# Patient Record
Sex: Male | Born: 1985
Health system: Southern US, Community
[De-identification: ages and names within clinical notes are randomized; demographics above are authoritative.]

## PROBLEM LIST (undated history)

## (undated) DIAGNOSIS — Z889 Allergy status to unspecified drugs, medicaments and biological substances status: Secondary | ICD-10-CM

## (undated) DIAGNOSIS — N289 Disorder of kidney and ureter, unspecified: Secondary | ICD-10-CM

## (undated) DIAGNOSIS — I639 Cerebral infarction, unspecified: Secondary | ICD-10-CM

## (undated) DIAGNOSIS — G43909 Migraine, unspecified, not intractable, without status migrainosus: Secondary | ICD-10-CM

## (undated) HISTORY — PX: WISDOM TOOTH EXTRACTION: SHX21

## (undated) HISTORY — PX: FINGER SURGERY: SHX640

## (undated) HISTORY — DX: Cerebral infarction, unspecified: I63.9

## (undated) HISTORY — DX: Migraine, unspecified, not intractable, without status migrainosus: G43.909

---

## 2004-12-23 ENCOUNTER — Emergency Department (HOSPITAL_COMMUNITY): Admission: EM | Admit: 2004-12-23 | Discharge: 2004-12-23 | Payer: Self-pay | Admitting: Emergency Medicine
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
## 2005-04-18 ENCOUNTER — Other Ambulatory Visit: Admission: RE | Admit: 2005-04-18 | Discharge: 2005-04-18 | Payer: Self-pay | Admitting: Otolaryngology

## 2007-03-26 ENCOUNTER — Emergency Department (HOSPITAL_COMMUNITY): Admission: EM | Admit: 2007-03-26 | Discharge: 2007-03-26 | Payer: Self-pay | Admitting: Emergency Medicine

## 2009-07-03 ENCOUNTER — Encounter: Payer: Self-pay | Admitting: Emergency Medicine
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
## 2009-07-04 ENCOUNTER — Inpatient Hospital Stay (HOSPITAL_COMMUNITY)
Admission: EM | Admit: 2009-07-04 | Discharge: 2009-07-05 | Payer: Self-pay | Source: Home / Self Care | Admitting: Emergency Medicine
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
## 2010-06-05 LAB — URINE MICROSCOPIC-ADD ON

## 2010-06-05 LAB — CBC
HCT: 44.1 % (ref 39.0–52.0)
Hemoglobin: 15.5 g/dL (ref 13.0–17.0)
MCHC: 35.1 g/dL (ref 30.0–36.0)
MCV: 87.8 fL (ref 78.0–100.0)
Platelets: 260 10*3/uL (ref 150–400)
RBC: 5.02 MIL/uL (ref 4.22–5.81)
RDW: 12 % (ref 11.5–15.5)
WBC: 12.1 10*3/uL — ABNORMAL HIGH (ref 4.0–10.5)

## 2010-06-05 LAB — COMPREHENSIVE METABOLIC PANEL
ALT: 72 U/L — ABNORMAL HIGH (ref 0–53)
AST: 71 U/L — ABNORMAL HIGH (ref 0–37)
Albumin: 4.4 g/dL (ref 3.5–5.2)
Alkaline Phosphatase: 75 U/L (ref 39–117)
BUN: 14 mg/dL (ref 6–23)
CO2: 27 mEq/L (ref 19–32)
Calcium: 9.2 mg/dL (ref 8.4–10.5)
Chloride: 104 mEq/L (ref 96–112)
Creatinine, Ser: 0.97 mg/dL (ref 0.4–1.5)
GFR calc Af Amer: >60 mL/min (ref >60)
GFR calc non Af Amer: >60 mL/min (ref >60)
Glucose, Bld: 121 mg/dL — ABNORMAL HIGH (ref 70–99)
Potassium: 3.8 mEq/L (ref 3.5–5.1)
Sodium: 138 mEq/L (ref 135–145)
Total Bilirubin: 0.6 mg/dL (ref 0.3–1.2)
Total Protein: 7.1 g/dL (ref 6.0–8.3)

## 2010-06-05 LAB — URINALYSIS, ROUTINE W REFLEX MICROSCOPIC
Bilirubin Urine: NEGATIVE
Glucose, UA: NEGATIVE mg/dL
Ketones, ur: NEGATIVE mg/dL
Leukocytes, UA: NEGATIVE
Nitrite: NEGATIVE
Protein, ur: NEGATIVE mg/dL
Specific Gravity, Urine: 1.025 (ref 1.005–1.030)
Urobilinogen, UA: 0.2 mg/dL (ref 0.0–1.0)
pH: 6 (ref 5.0–8.0)

## 2010-06-05 LAB — PROTIME-INR
INR: 1.04 (ref 0.00–1.49)
Prothrombin Time: 13.5 seconds (ref 11.6–15.2)

## 2010-06-05 LAB — LACTIC ACID, PLASMA: Lactic Acid, Venous: 1.4 mmol/L (ref 0.5–2.2)

## 2010-06-05 LAB — RAPID URINE DRUG SCREEN, HOSP PERFORMED
Amphetamines: NOT DETECTED
Barbiturates: NOT DETECTED
Benzodiazepines: NOT DETECTED
Cocaine: NOT DETECTED
Opiates: NOT DETECTED
Tetrahydrocannabinol: NOT DETECTED

## 2010-06-05 LAB — TYPE AND SCREEN
ABO/RH(D): O POS
Antibody Screen: NEGATIVE

## 2010-06-05 LAB — ETHANOL: Alcohol, Ethyl (B): <5 mg/dL (ref 0–10)

## 2010-06-05 LAB — APTT: aPTT: 25 seconds (ref 24–37)

## 2012-10-10 ENCOUNTER — Emergency Department (HOSPITAL_COMMUNITY)
Admission: EM | Admit: 2012-10-10 | Discharge: 2012-10-10 | Disposition: A | Discharge status: Home / Self Care | Payer: BC Managed Care – PPO | Attending: Emergency Medicine | Admitting: Emergency Medicine

## 2012-10-10 ENCOUNTER — Encounter (HOSPITAL_COMMUNITY): Payer: Self-pay

## 2012-10-10 DIAGNOSIS — S0501XA Injury of conjunctiva and corneal abrasion without foreign body, right eye, initial encounter: Secondary | ICD-10-CM

## 2012-10-10 DIAGNOSIS — IMO0002 Reserved for concepts with insufficient information to code with codable children: Secondary | ICD-10-CM | POA: Insufficient documentation

## 2012-10-10 DIAGNOSIS — Y929 Unspecified place or not applicable: Secondary | ICD-10-CM | POA: Insufficient documentation

## 2012-10-10 DIAGNOSIS — F411 Generalized anxiety disorder: Secondary | ICD-10-CM | POA: Insufficient documentation

## 2012-10-10 DIAGNOSIS — S058X9A Other injuries of unspecified eye and orbit, initial encounter: Secondary | ICD-10-CM | POA: Insufficient documentation

## 2012-10-10 DIAGNOSIS — Y9389 Activity, other specified: Secondary | ICD-10-CM | POA: Insufficient documentation

## 2012-10-10 MED ORDER — TETRACAINE HCL 0.5 % OP SOLN
2.0000 [drp] | Freq: Once | OPHTHALMIC | Status: AC
  Administered 2012-10-10: 2 [drp] via OPHTHALMIC
  Filled 2012-10-10: qty 2

## 2012-10-10 MED ORDER — HYDROCODONE-ACETAMINOPHEN 5-325 MG PO TABS: ORAL_TABLET | ORAL | Status: DC

## 2012-10-10 MED ORDER — FLUORESCEIN SODIUM 1 MG OP STRP
ORAL_STRIP | OPHTHALMIC | Status: AC
  Filled 2012-10-10: qty 1

## 2012-10-10 MED ORDER — TOBRAMYCIN 0.3 % OP SOLN
2.0000 [drp] | Freq: Once | OPHTHALMIC | Status: AC
  Administered 2012-10-10: 2 [drp] via OPHTHALMIC
  Filled 2012-10-10: qty 5

## 2012-10-10 MED ORDER — HYDROCODONE-ACETAMINOPHEN 5-325 MG PO TABS
2.0000 | ORAL_TABLET | Freq: Once | ORAL | Status: AC
  Administered 2012-10-10: 2 via ORAL
  Filled 2012-10-10: qty 2

## 2012-10-10 MED ORDER — HYDROCODONE-ACETAMINOPHEN 5-325 MG PO TABS: 1.0000 | ORAL_TABLET | ORAL | Status: DC | PRN

## 2012-10-10 NOTE — ED Provider Notes (Signed)
Medical screening examination/treatment/procedure(s) were performed by non-physician practitioner and as supervising physician I was immediately available for consultation/collaboration. Skylan Gift, MD, FACEP   Braelin Brosch L Colen Eltzroth, MD 10/10/12 2339 

## 2012-10-10 NOTE — ED Provider Notes (Signed)
CSN: [id number]     Arrival date & time 10/10/12  2046 History     First MD Initiated Contact with Patient 10/10/12 2104     Chief Complaint  Patient presents with  . Foreign Body in Eye   (Consider location/radiation/quality/duration/timing/severity/associated sxs/prior Treatment) HPI Comments: Patient states he was cutting plywood today when something went in his right eye. The patient states he immediately tried to rub it and then irrigated the eye. He later used eyedrops from over-the-counter, but noticed a sensation in the eye that something might be in it. He now presents to the emergency department for additional evaluation. The patient denies any previous operations or procedures involving the right eye. He has used over-the-counter Visine as well as a flush the eye but no other medications.  The history is provided by the patient.    History reviewed. No pertinent past medical history. History reviewed. No pertinent past surgical history. No family history on file. History  Substance Use Topics  . Smoking status: Never Smoker   . Smokeless tobacco: Not on file  . Alcohol Use: No    Review of Systems  Constitutional: Negative for activity change.       All ROS Neg except as noted in HPI  HENT: Negative for nosebleeds and neck pain.   Eyes: Positive for pain. Negative for photophobia and discharge.  Respiratory: Negative for cough, shortness of breath and wheezing.   Cardiovascular: Negative for chest pain and palpitations.  Gastrointestinal: Negative for abdominal pain and blood in stool.  Genitourinary: Negative for dysuria, frequency and hematuria.  Musculoskeletal: Negative for back pain and arthralgias.  Skin: Negative.   Neurological: Negative for dizziness, seizures and speech difficulty.  Psychiatric/Behavioral: Negative for hallucinations and confusion.    Allergies  Review of patient's allergies indicates no known allergies.  Home Medications  No current  outpatient prescriptions on file. BP 136/80  Pulse 76  Temp(Src) 98.3 F (36.8 C) (Oral)  Ht 5\' 10"  (1.778 m)  Wt 190 lb (86.183 kg)  BMI 27.26 kg/m2  SpO2 96% Physical Exam  Nursing note and vitals reviewed. Constitutional: He is oriented to person, place, and time. He appears well-developed and well-nourished.  Non-toxic appearance.  HENT:  Head: Normocephalic.  Right Ear: Tympanic membrane and external ear normal.  Left Ear: Tympanic membrane and external ear normal.  Eyes: EOM and lids are normal. Pupils are equal, round, and reactive to light.  The upper and lower eyelids were inverted and no foreign body appreciated. There is some swelling of the bulbar conjunctiva. There is increased redness of the conjunctiva.  The anterior chamber is clear. Fluorescein evaluation with slit-lamp suggests mild corneal abrasion present.  No periorbital tenderness appreciated.  Neck: Normal range of motion. Neck supple. Carotid bruit is not present.  Cardiovascular: Normal rate, regular rhythm, normal heart sounds, intact distal pulses and normal pulses.   Pulmonary/Chest: Breath sounds normal. No respiratory distress.  Abdominal: Soft. Bowel sounds are normal. There is no tenderness. There is no guarding.  Musculoskeletal: Normal range of motion.  Lymphadenopathy:       Head (right side): No submandibular adenopathy present.       Head (left side): No submandibular adenopathy present.    He has no cervical adenopathy.  Neurological: He is alert and oriented to person, place, and time. He has normal strength. No cranial nerve deficit or sensory deficit.  Skin: Skin is warm and dry.  Psychiatric: His speech is normal. His mood appears anxious.  ED Course   Procedures (including critical care time)  Labs Reviewed - No data to display No results found. No diagnosis found.  MDM  *I have reviewed nursing notes, vital signs, and all appropriate lab and imaging results for this  patient.** Patient was cutting plywood without eye safety gear today and noticed something went in his right eye. The patient has a flush the area and rubbed the eye and eyelid. On the examination there is mild swelling of the bulbar conjunctiva. There is suggestion of corneal abrasion on the fluorescein slit lamp exam. Patient will use dark glasses and a hat until eye problem resolves. He has been given tobramycin eyedrops every 4 hours for the next 5-7 days. He is given a prescription for Norco for pain and headache if needed. He is to see the eye specialist if not improving, or return to the emergency department.  Kathie Dike, PA-C 10/10/12 2239

## 2012-10-10 NOTE — ED Notes (Signed)
Pt presents with left eye pain. Pt states he was cutting wood when he felt something fly into his eye. Pt has redness and irritation to the eye. Pt reports "some" blurred vision.

## 2012-10-10 NOTE — ED Notes (Signed)
Cutting come plywood and got something in my right eye per pt. Not sure if it is out, real irritated per pt. Can't open my eye.

## 2012-10-10 NOTE — ED Notes (Signed)
Can see pretty good, right eye has been flushed out and visine has been used in eye.

## 2012-10-12 MED FILL — Hydrocodone-Acetaminophen Tab 5-325 MG: ORAL | Qty: 6 | Status: AC

## 2012-12-17 ENCOUNTER — Emergency Department (HOSPITAL_COMMUNITY): Payer: BC Managed Care – PPO

## 2012-12-17 ENCOUNTER — Emergency Department (HOSPITAL_COMMUNITY)
Admission: EM | Admit: 2012-12-17 | Discharge: 2012-12-17 | Disposition: A | Discharge status: Home / Self Care | Payer: BC Managed Care – PPO | Attending: Emergency Medicine | Admitting: Emergency Medicine

## 2012-12-17 ENCOUNTER — Encounter (HOSPITAL_COMMUNITY): Payer: Self-pay | Admitting: Emergency Medicine

## 2012-12-17 DIAGNOSIS — N1330 Unspecified hydronephrosis: Secondary | ICD-10-CM | POA: Insufficient documentation

## 2012-12-17 DIAGNOSIS — N132 Hydronephrosis with renal and ureteral calculous obstruction: Secondary | ICD-10-CM

## 2012-12-17 DIAGNOSIS — R52 Pain, unspecified: Secondary | ICD-10-CM | POA: Insufficient documentation

## 2012-12-17 DIAGNOSIS — N201 Calculus of ureter: Secondary | ICD-10-CM | POA: Insufficient documentation

## 2012-12-17 LAB — URINALYSIS, ROUTINE W REFLEX MICROSCOPIC
Bilirubin Urine: NEGATIVE
Glucose, UA: NEGATIVE mg/dL
Ketones, ur: NEGATIVE mg/dL
Leukocytes, UA: NEGATIVE
Nitrite: NEGATIVE
Protein, ur: NEGATIVE mg/dL
Specific Gravity, Urine: 1.027 (ref 1.005–1.030)
Urobilinogen, UA: 0.2 mg/dL (ref 0.0–1.0)
pH: 6.5 (ref 5.0–8.0)

## 2012-12-17 LAB — URINE MICROSCOPIC-ADD ON

## 2012-12-17 IMAGING — CT CT ABD-PELV W/O CM
1 series · 15 of 28 positions shown, 19 images · non-contrast
Comparison: 07/03/2009

CLINICAL DATA: Left flank pain

EXAM:
CT ABDOMEN AND PELVIS WITHOUT CONTRAST
TECHNIQUE: Multidetector CT imaging of the abdomen and pelvis was performed
following the standard protocol without intravenous contrast.

[Series 4: lung · axial · 0.74mm/px · z∈[-161,-41]mm · 15 of 28 slices shown, 19 images]
[im 3/28  soft-tissue]
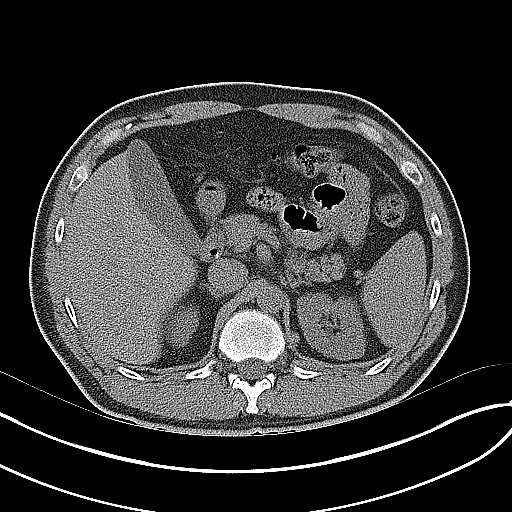
[im 3/28  bone]
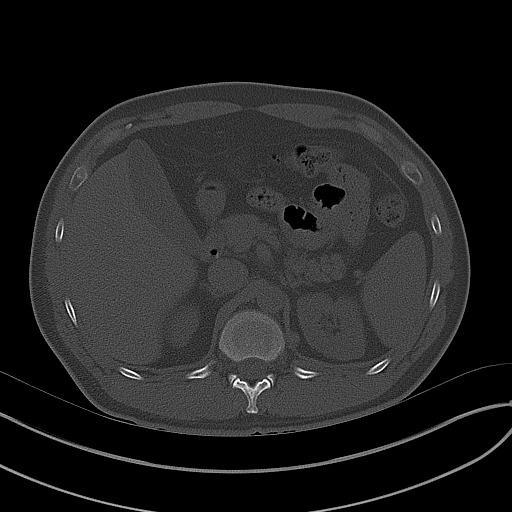
[im 5/28  soft-tissue]
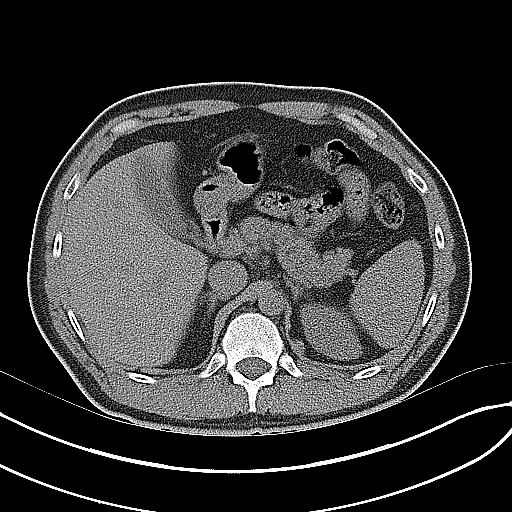
[im 7/28  soft-tissue]
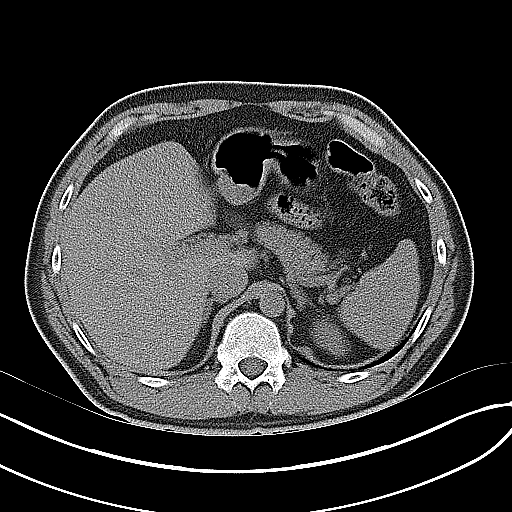
[im 9/28  soft-tissue]
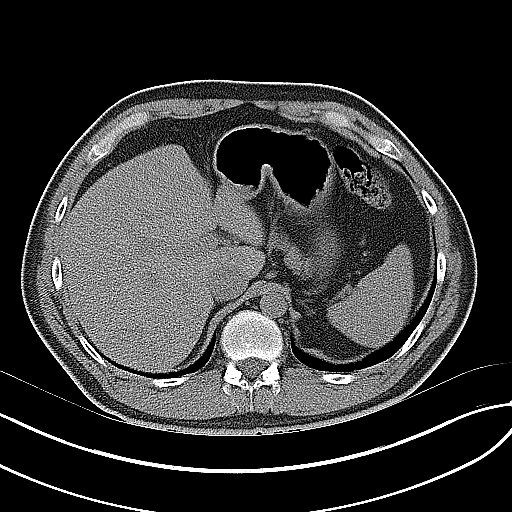
[im 11/28  soft-tissue]
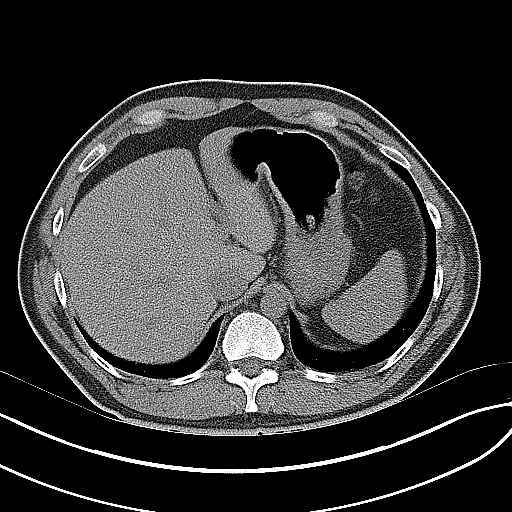
[im 13/28  soft-tissue]
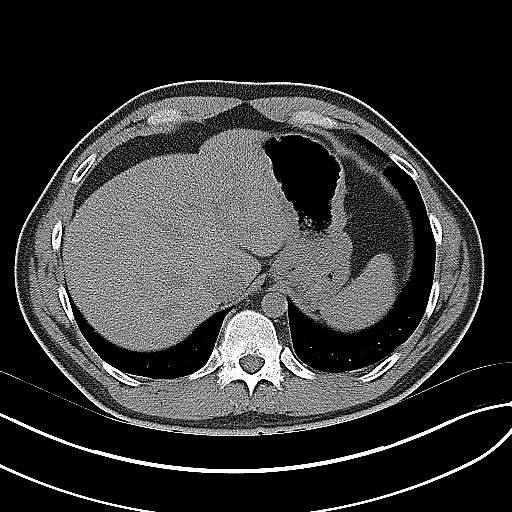
[im 15/28  soft-tissue]
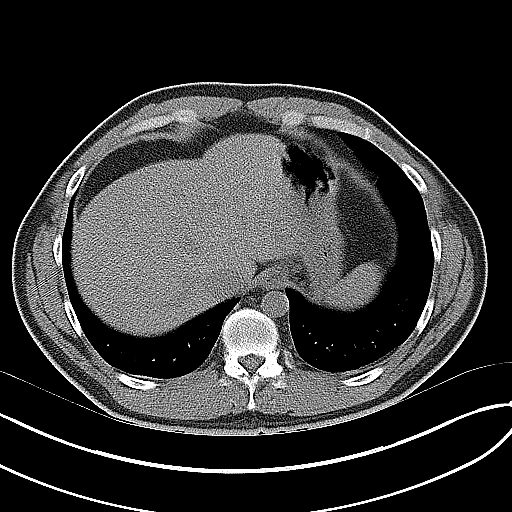
[im 17/28  soft-tissue]
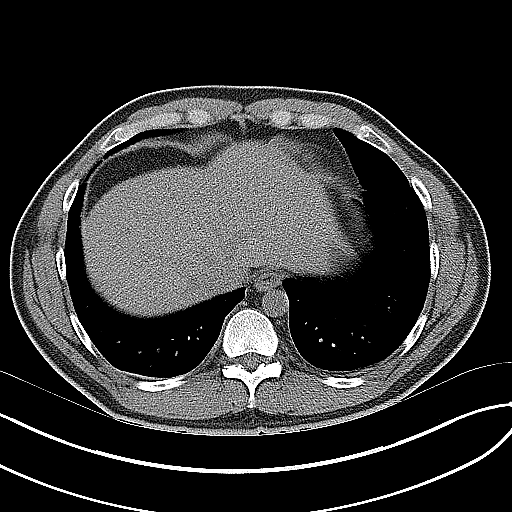
[im 19/28  soft-tissue]
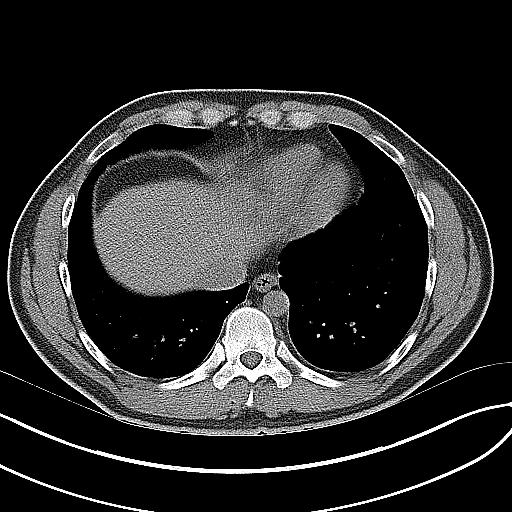
[im 19/28  bone]
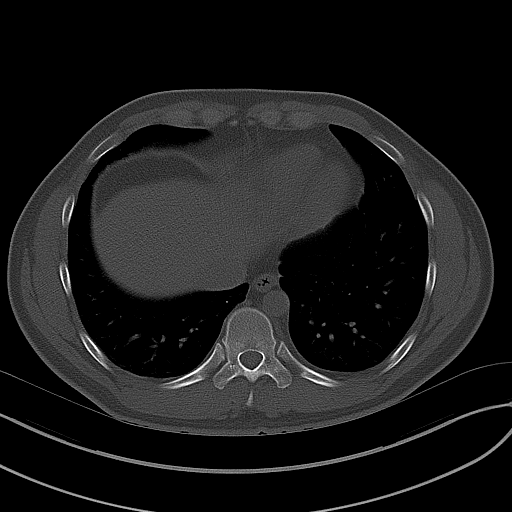
[im 21/28  soft-tissue]
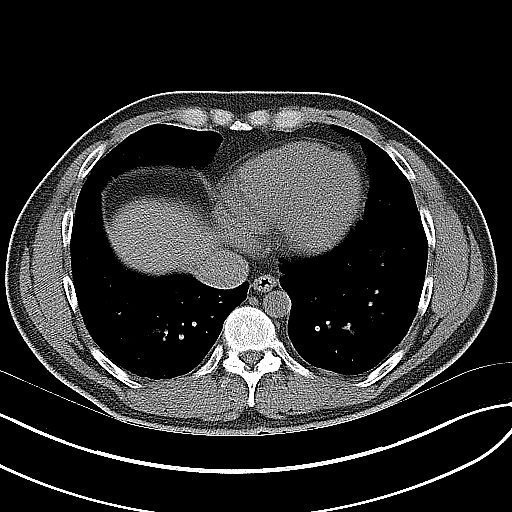
[im 23/28  soft-tissue]
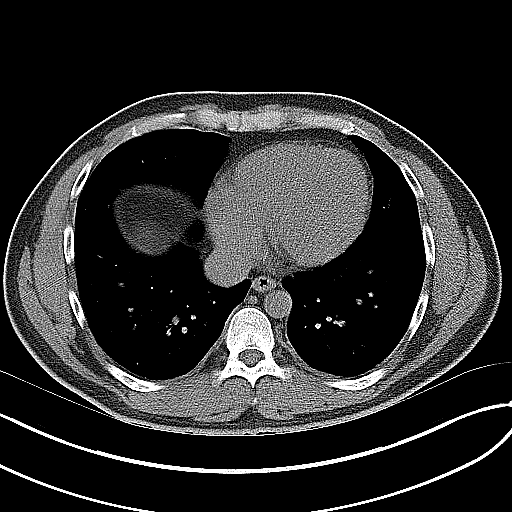
[im 24/28  lung]
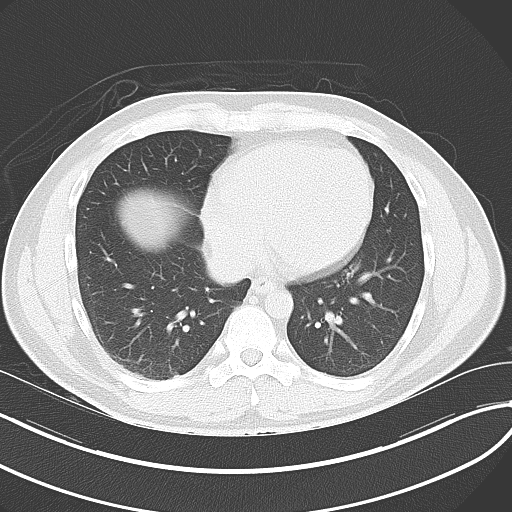
[im 25/28  soft-tissue]
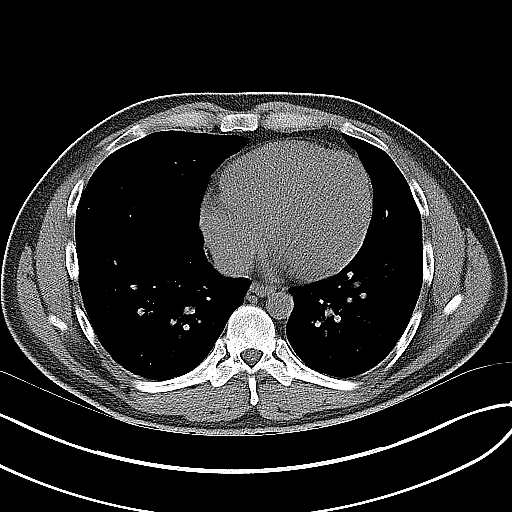
[im 25/28  lung]
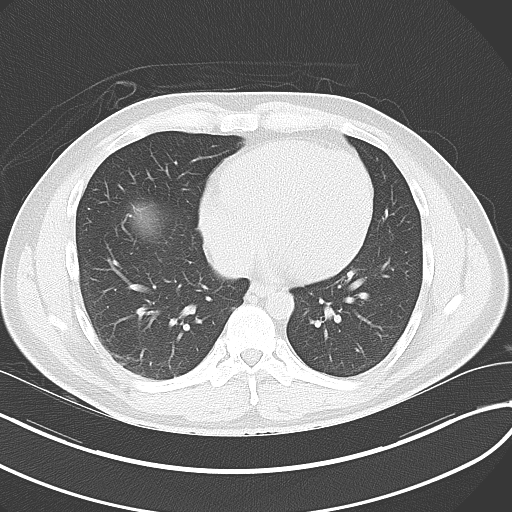
[im 26/28  lung]
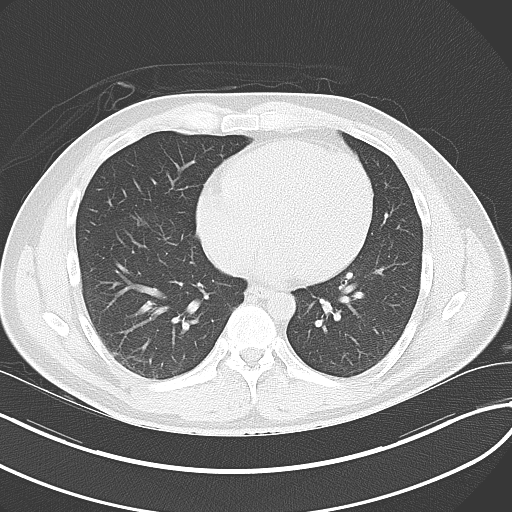
[im 27/28  soft-tissue]
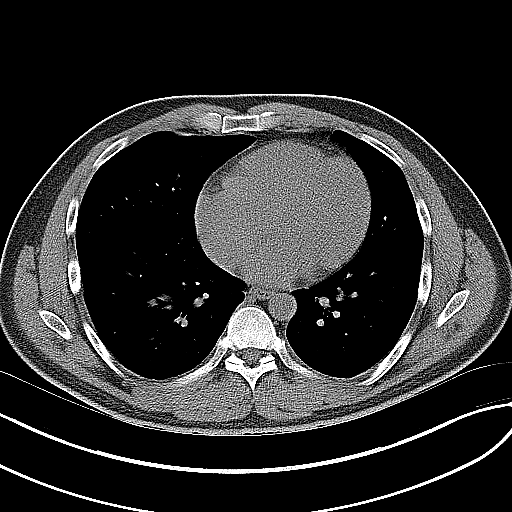
[im 27/28  lung]
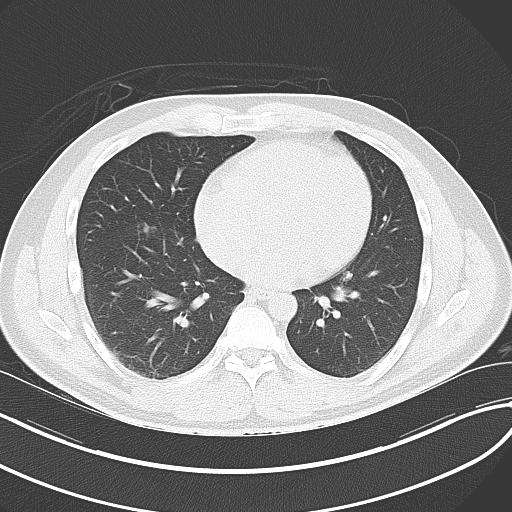

[15 of 28 positions shown; findings below may reference images not displayed]

FINDINGS: The lung bases are free of acute infiltrate or sizable effusion.

The liver, spleen, adrenal glands, gallbladder and pancreas are
within normal limits. The kidneys are well visualized bilaterally
without evidence of renal calculi. Mild fullness of the left renal
collecting system and left ureter are noted when compared with that
of the right. This extends to the urinary bladder without definitive
calculus. This could be related to a poorly calcified stone or edema
from recently passed stone. Correlation with the patient's clinical
history is recommended.

The appendix is well visualized and within normal limits. The colon
shows very mild diverticular change without diverticulitis. The
bladder is decompressed. No pelvic mass lesion is noted. No acute
bony abnormality is seen.
IMPRESSION: Mild fullness of the left renal collecting system and ureter. This
may be related to edema from recently passed stone or a poorly
calcified stone.

No other focal abnormality is seen.

## 2012-12-17 MED ORDER — ONDANSETRON 8 MG PO TBDP: 8.0000 mg | ORAL_TABLET | Freq: Three times a day (TID) | ORAL | Status: DC | PRN

## 2012-12-17 MED ORDER — KETOROLAC TROMETHAMINE 30 MG/ML IJ SOLN
30.0000 mg | Freq: Once | INTRAMUSCULAR | Status: AC
  Administered 2012-12-17: 30 mg via INTRAVENOUS
  Filled 2012-12-17: qty 1

## 2012-12-17 MED ORDER — KETOROLAC TROMETHAMINE 10 MG PO TABS: 10.0000 mg | ORAL_TABLET | Freq: Four times a day (QID) | ORAL | Status: DC

## 2012-12-17 MED ORDER — ONDANSETRON HCL 4 MG/2ML IJ SOLN
4.0000 mg | Freq: Once | INTRAMUSCULAR | Status: AC
  Administered 2012-12-17: 4 mg via INTRAVENOUS
  Filled 2012-12-17: qty 2

## 2012-12-17 MED ORDER — HYDROMORPHONE HCL PF 1 MG/ML IJ SOLN
1.0000 mg | Freq: Once | INTRAMUSCULAR | Status: AC
  Administered 2012-12-17: 1 mg via INTRAVENOUS
  Filled 2012-12-17: qty 1

## 2012-12-17 MED ORDER — OXYCODONE-ACETAMINOPHEN 5-325 MG PO TABS: 1.0000 | ORAL_TABLET | Freq: Four times a day (QID) | ORAL | Status: DC | PRN

## 2012-12-17 MED ORDER — SODIUM CHLORIDE 0.9 % IV SOLN
INTRAVENOUS | Status: DC
  Administered 2012-12-17: 15:00:00 via INTRAVENOUS

## 2012-12-17 NOTE — ED Notes (Signed)
Pt c/o of sudden onset of left side flank pain. Denies blood in urin, pain upon urination, no history. Fent. Pain 4/10.

## 2012-12-17 NOTE — ED Provider Notes (Signed)
CSN: [id number]     Arrival date & time 12/17/12  1446 History   First MD Initiated Contact with Patient 12/17/12 1455     Chief Complaint  Patient presents with  . Flank Pain   (Consider location/radiation/quality/duration/timing/severity/associated sxs/prior Treatment) HPI Patient reports he was at work and after lunch he had acute onset of left flank pain after bending down to get his uniform to put on. He states it started about 45 minutes prior to EMS arrival. He states the pain is in his left flank area and he now has a pressure feeling in his left lower quadrant. He has had nausea without vomiting. He states he feels the need to defecate however he had a bowel movement without relief of pain. He states the pain was so bad he was laying on the floor. He states nothing he does makes it feel better, nothing he does makes it feel worse. He denies having hematuria. He denies having dysuria or frequency. He states he's never had this discomfort before. He denies any family history of renal stones. Patient does not drink a lot of milk but he does drink a lot of caffeine. He reports his pain at its worse was a 9/10, after EMS gave meds it was a 3/10 and his pain is returning now and is a 9/10 again.   PCP none  History reviewed. No pertinent past medical history. History reviewed. No pertinent past surgical history. History reviewed. No pertinent family history. History  Substance Use Topics  . Smoking status: Never Smoker   . Smokeless tobacco: Not on file  . Alcohol Use: No  lives at home  Lives with spouse employed  Review of Systems  All other systems reviewed and are negative.    Allergies  Review of patient's allergies indicates no known allergies.  Home Medications  No current outpatient prescriptions on file.   BP 121/66  Pulse 69  Temp(Src) 98.7 F (37.1 C) (Oral)  Resp 20  SpO2 98%  Vital signs normal   Physical Exam  Nursing note and vitals  reviewed. Constitutional: He is oriented to person, place, and time. He appears well-developed and well-nourished.  Non-toxic appearance. He does not appear ill. He appears distressed.  HENT:  Head: Normocephalic and atraumatic.  Right Ear: External ear normal.  Left Ear: External ear normal.  Nose: Nose normal. No mucosal edema or rhinorrhea.  Mouth/Throat: Oropharynx is clear and moist and mucous membranes are normal. No dental abscesses or edematous.  Eyes: Conjunctivae and EOM are normal. Pupils are equal, round, and reactive to light.  Neck: Normal range of motion and full passive range of motion without pain. Neck supple.  Cardiovascular: Normal rate, regular rhythm and normal heart sounds.  Exam reveals no gallop and no friction rub.   No murmur heard. Pulmonary/Chest: Effort normal and breath sounds normal. No respiratory distress. He has no wheezes. He has no rhonchi. He has no rales. He exhibits no tenderness and no crepitus.  Abdominal: Soft. Normal appearance and bowel sounds are normal. He exhibits no distension. There is no tenderness. There is no rebound and no guarding.    Area pressure, nontender  Musculoskeletal: Normal range of motion. He exhibits no edema and no tenderness.       Back:  Moves all extremities well.   Area of pain noted  Neurological: He is alert and oriented to person, place, and time. He has normal strength. No cranial nerve deficit.  Skin: Skin is warm, dry and  intact. No rash noted. No erythema. There is pallor.  Psychiatric: He has a normal mood and affect. His speech is normal and behavior is normal. His mood appears not anxious.    ED Course  Procedures (including critical care time)  Medications  0.9 %  sodium chloride infusion ( Intravenous Stopped 12/17/12 1701)  ketorolac (TORADOL) 30 MG/ML injection 30 mg (not administered)  HYDROmorphone (DILAUDID) injection 1 mg (1 mg Intravenous Given 12/17/12 1524)  ondansetron (ZOFRAN) injection 4  mg (4 mg Intravenous Given 12/17/12 1524)   Pt states his pain is better, still looks pale.   Labs Review Results for orders placed during the hospital encounter of 12/17/12  URINALYSIS, ROUTINE W REFLEX MICROSCOPIC      Result Value Range   Color, Urine YELLOW  YELLOW   APPearance CLOUDY (*) CLEAR   Specific Gravity, Urine 1.027  1.005 - 1.030   pH 6.5  5.0 - 8.0   Glucose, UA NEGATIVE  NEGATIVE mg/dL   Hgb urine dipstick LARGE (*) NEGATIVE   Bilirubin Urine NEGATIVE  NEGATIVE   Ketones, ur NEGATIVE  NEGATIVE mg/dL   Protein, ur NEGATIVE  NEGATIVE mg/dL   Urobilinogen, UA 0.2  0.0 - 1.0 mg/dL   Nitrite NEGATIVE  NEGATIVE   Leukocytes, UA NEGATIVE  NEGATIVE  URINE MICROSCOPIC-ADD ON      Result Value Range   Squamous Epithelial / LPF RARE  RARE   RBC / HPF 21-50  <3 RBC/hpf   Laboratory interpretation all normal except hematuria   Imaging Review Ct Abdomen Pelvis Wo Contrast  12/17/2012   CLINICAL DATA:  Left flank pain  EXAM: CT ABDOMEN AND PELVIS WITHOUT CONTRAST  TECHNIQUE: Multidetector CT imaging of the abdomen and pelvis was performed following the standard protocol without intravenous contrast.  COMPARISON:  07/03/2009  FINDINGS: The lung bases are free of acute infiltrate or sizable effusion.  The liver, spleen, adrenal glands, gallbladder and pancreas are within normal limits. The kidneys are well visualized bilaterally without evidence of renal calculi. Mild fullness of the left renal collecting system and left ureter are noted when compared with that of the right. This extends to the urinary bladder without definitive calculus. This could be related to a poorly calcified stone or edema from recently passed stone. Correlation with the patient's clinical history is recommended.  The appendix is well visualized and within normal limits. The colon shows very mild diverticular change without diverticulitis. The bladder is decompressed. No pelvic mass lesion is noted. No acute bony  abnormality is seen.  IMPRESSION: Mild fullness of the left renal collecting system and ureter. This may be related to edema from recently passed stone or a poorly calcified stone.  No other focal abnormality is seen.   Electronically Signed   By: Alcide Clever   On: 12/17/2012 16:28    MDM   1. Ureteral stone with hydronephrosis     New Prescriptions   KETOROLAC (TORADOL) 10 MG TABLET    Take 1 tablet (10 mg total) by mouth QID.   ONDANSETRON (ZOFRAN ODT) 8 MG DISINTEGRATING TABLET    Take 1 tablet (8 mg total) by mouth every 8 (eight) hours as needed for nausea.   OXYCODONE-ACETAMINOPHEN (PERCOCET/ROXICET) 5-325 MG PER TABLET    Take 1 tablet by mouth every 6 (six) hours as needed for pain.    Plan discharge   Devoria Albe, MD, Franz Dell, MD 12/17/12 [phone]

## 2012-12-17 NOTE — ED Notes (Signed)
Bed: ZO10 Expected date:  Expected time:  Means of arrival:  Comments: ems- 27 yo M, sudden onset flank pain

## 2013-10-22 ENCOUNTER — Encounter: Payer: Self-pay | Admitting: Family Medicine

## 2013-10-22 ENCOUNTER — Encounter (INDEPENDENT_AMBULATORY_CARE_PROVIDER_SITE_OTHER): Payer: Self-pay

## 2013-10-22 ENCOUNTER — Ambulatory Visit (INDEPENDENT_AMBULATORY_CARE_PROVIDER_SITE_OTHER): Payer: BC Managed Care – PPO | Admitting: Family Medicine

## 2013-10-22 VITALS — BP 121/78 | HR 58 | Temp 96.8°F | Ht 70.0 in | Wt 190.0 lb

## 2013-10-22 DIAGNOSIS — J0100 Acute maxillary sinusitis, unspecified: Secondary | ICD-10-CM

## 2013-10-22 MED ORDER — AMOXICILLIN 875 MG PO TABS: 875.0000 mg | ORAL_TABLET | Freq: Two times a day (BID) | ORAL | Status: DC

## 2013-10-22 MED ORDER — METHYLPREDNISOLONE ACETATE 80 MG/ML IJ SUSP
80.0000 mg | Freq: Once | INTRAMUSCULAR | Status: AC
  Administered 2013-10-22: 80 mg via INTRAMUSCULAR

## 2013-10-22 NOTE — Progress Notes (Signed)
   Subjective:    Patient ID: Kevin Hughes, male    DOB: Aug 12, 1985, 28 y.o.   MRN: [id number]  HPI  This 28 y.o. male presents for evaluation of facial pressure and URI sx's for a week.  He c/o mucopurulent sinus drainage this am and feeling washed out.  Review of Systems C/o sinus pain and pressure   No chest pain, SOB, HA, dizziness, vision change, N/V, diarrhea, constipation, dysuria, urinary urgency or frequency, myalgias, arthralgias or rash.  Objective:   Physical Exam  Vital signs noted  Well developed well nourished male.  HEENT - Head atraumatic Normocephalic                Eyes - PERRLA, Conjuctiva - clear Sclera- Clear EOMI                Ears - EAC's Wnl TM's Wnl Gross Hearing WNL                Nose - Nares patent                 Throat - oropharanx wnl Respiratory - Lungs CTA bilateral Cardiac - RRR S1 and S2 without murmur GI - Abdomen soft Nontender and bowel sounds active x 4 Extremities - No edema. Neuro - Grossly intact.      Assessment & Plan:  Acute maxillary sinusitis, recurrence not specified - Plan: amoxicillin (AMOXIL) 875 MG tablet, methylPREDNISolone acetate (DEPO-MEDROL) injection 80 mg  Push po fluids, rest, tylenol and motrin otc prn as directed for fever, arthralgias, and myalgias.  Follow up prn if sx's continue or persist.   Deatra CanterWilliam J Oxford FNP

## 2013-10-25 ENCOUNTER — Encounter (HOSPITAL_COMMUNITY): Payer: Self-pay | Admitting: Emergency Medicine

## 2013-11-23 ENCOUNTER — Encounter: Payer: Self-pay | Admitting: Family Medicine

## 2013-11-23 ENCOUNTER — Ambulatory Visit (INDEPENDENT_AMBULATORY_CARE_PROVIDER_SITE_OTHER): Payer: BC Managed Care – PPO | Admitting: Family Medicine

## 2013-11-23 VITALS — BP 119/78 | HR 84 | Temp 97.8°F | Ht 70.0 in | Wt 187.7 lb

## 2013-11-23 DIAGNOSIS — J0100 Acute maxillary sinusitis, unspecified: Secondary | ICD-10-CM

## 2013-11-23 MED ORDER — AMOXICILLIN 875 MG PO TABS: 875.0000 mg | ORAL_TABLET | Freq: Two times a day (BID) | ORAL | Status: DC

## 2013-11-23 MED ORDER — METHYLPREDNISOLONE ACETATE 80 MG/ML IJ SUSP
80.0000 mg | Freq: Once | INTRAMUSCULAR | Status: AC
  Administered 2013-11-23: 80 mg via INTRAMUSCULAR

## 2013-11-23 NOTE — Progress Notes (Signed)
   Subjective:    Patient ID: Delan Ksiazek, male    DOB: 1985/07/19, 28 y.o.   MRN: [id number]  HPI This 28 y.o. male presents for evaluation of c/o sinus pressure and uri sx's.   Review of Systems No chest pain, SOB, HA, dizziness, vision change, N/V, diarrhea, constipation, dysuria, urinary urgency or frequency, myalgias, arthralgias or rash.     Objective:   Physical Exam  Vital signs noted  Well developed well nourished male.  HEENT - Head atraumatic Normocephalic                Eyes - PERRLA, Conjuctiva - clear Sclera- Clear EOMI                Ears - EAC's Wnl TM's Wnl Gross Hearing WNL                Nose - Nares patent                 Throat - oropharanx wnl Respiratory - Lungs CTA bilateral Cardiac - RRR S1 and S2 without murmur GI - Abdomen soft Nontender and bowel sounds active x 4 Extremities - No edema. Neuro - Grossly intact.      Assessment & Plan:  Acute maxillary sinusitis, recurrence not specified - Plan: amoxicillin (AMOXIL) 875 MG tablet, methylPREDNISolone acetate (DEPO-MEDROL) injection 80 mg  Push po fluids, rest, tylenol and motrin otc prn as directed for fever, arthralgias, and myalgias.  Follow up prn if sx's continue or persist.  Deatra Canter FNP

## 2013-11-25 ENCOUNTER — Telehealth: Payer: Self-pay | Admitting: *Deleted

## 2013-11-25 ENCOUNTER — Encounter: Payer: Self-pay | Admitting: *Deleted

## 2013-11-25 NOTE — Telephone Encounter (Signed)
Patient has not been able to sleep since he was seen due to cough. Can something be sent in for cough? Also wants to know if he can have a work note for today and tomorrow. Please advise

## 2013-11-29 NOTE — Telephone Encounter (Signed)
Need to be seen for work note and recommend delsyn otc until seen

## 2013-11-29 NOTE — Telephone Encounter (Signed)
Work note was given on 11/25/13 to cover until 11/27/13.

## 2014-04-30 ENCOUNTER — Ambulatory Visit (INDEPENDENT_AMBULATORY_CARE_PROVIDER_SITE_OTHER): Payer: BLUE CROSS/BLUE SHIELD | Admitting: Nurse Practitioner

## 2014-04-30 VITALS — BP 136/79 | HR 64 | Temp 97.6°F | Ht 70.0 in | Wt 190.6 lb

## 2014-04-30 DIAGNOSIS — M545 Low back pain, unspecified: Secondary | ICD-10-CM

## 2014-04-30 LAB — POCT URINALYSIS DIPSTICK
Bilirubin, UA: NEGATIVE
Glucose, UA: NEGATIVE
Ketones, UA: NEGATIVE
Leukocytes, UA: NEGATIVE
Nitrite, UA: NEGATIVE
Protein, UA: NEGATIVE
Spec Grav, UA: 1.015
Urobilinogen, UA: NEGATIVE
pH, UA: 7

## 2014-04-30 LAB — POCT UA - MICROSCOPIC ONLY
Casts, Ur, LPF, POC: NEGATIVE
Crystals, Ur, HPF, POC: NEGATIVE
Mucus, UA: NEGATIVE

## 2014-04-30 MED ORDER — CYCLOBENZAPRINE HCL 10 MG PO TABS: 10.0000 mg | ORAL_TABLET | Freq: Three times a day (TID) | ORAL | Status: DC | PRN

## 2014-04-30 MED ORDER — NAPROXEN 500 MG PO TABS: 500.0000 mg | ORAL_TABLET | Freq: Two times a day (BID) | ORAL | Status: DC

## 2014-04-30 NOTE — Patient Instructions (Signed)
Back Pain, Adult Low back pain is very common. About 1 in 5 people have back pain.The cause of low back pain is rarely dangerous. The pain often gets better over time.About half of people with a sudden onset of back pain feel better in just 2 weeks. About 8 in 10 people feel better by 6 weeks.  CAUSES Some common causes of back pain include:  Strain of the muscles or ligaments supporting the spine.  Wear and tear (degeneration) of the spinal discs.  Arthritis.  Direct injury to the back. DIAGNOSIS Most of the time, the direct cause of low back pain is not known.However, back pain can be treated effectively even when the exact cause of the pain is unknown.Answering your caregiver's questions about your overall health and symptoms is one of the most accurate ways to make sure the cause of your pain is not dangerous. If your caregiver needs more information, he or she may order lab work or imaging tests (X-rays or MRIs).However, even if imaging tests show changes in your back, this usually does not require surgery. HOME CARE INSTRUCTIONS For many people, back pain returns.Since low back pain is rarely dangerous, it is often a condition that people can learn to manageon their own.   Remain active. It is stressful on the back to sit or stand in one place. Do not sit, drive, or stand in one place for more than 30 minutes at a time. Take short walks on level surfaces as soon as pain allows.Try to increase the length of time you walk each day.  Do not stay in bed.Resting more than 1 or 2 days can delay your recovery.  Do not avoid exercise or work.Your body is made to move.It is not dangerous to be active, even though your back may hurt.Your back will likely heal faster if you return to being active before your pain is gone.  Pay attention to your body when you bend and lift. Many people have less discomfortwhen lifting if they bend their knees, keep the load close to their bodies,and  avoid twisting. Often, the most comfortable positions are those that put less stress on your recovering back.  Find a comfortable position to sleep. Use a firm mattress and lie on your side with your knees slightly bent. If you lie on your back, put a pillow under your knees.  Only take over-the-counter or prescription medicines as directed by your caregiver. Over-the-counter medicines to reduce pain and inflammation are often the most helpful.Your caregiver may prescribe muscle relaxant drugs.These medicines help dull your pain so you can more quickly return to your normal activities and healthy exercise.  Put ice on the injured area.  Put ice in a plastic bag.  Place a towel between your skin and the bag.  Leave the ice on for 15-20 minutes, 03-04 times a day for the first 2 to 3 days. After that, ice and heat may be alternated to reduce pain and spasms.  Ask your caregiver about trying back exercises and gentle massage. This may be of some benefit.  Avoid feeling anxious or stressed.Stress increases muscle tension and can worsen back pain.It is important to recognize when you are anxious or stressed and learn ways to manage it.Exercise is a great option. SEEK MEDICAL CARE IF:  You have pain that is not relieved with rest or medicine.  You have pain that does not improve in 1 week.  You have new symptoms.  You are generally not feeling well. SEEK   IMMEDIATE MEDICAL CARE IF:   You have pain that radiates from your back into your legs.  You develop new bowel or bladder control problems.  You have unusual weakness or numbness in your arms or legs.  You develop nausea or vomiting.  You develop abdominal pain.  You feel faint. Document Released: 03/04/2005 Document Revised: 09/03/2011 Document Reviewed: 07/06/2013 [employer] Patient Information 2015 [employer]. This information is not intended to replace advice given to you by your health care provider. Make sure you  discuss any questions you have with your health care provider.  

## 2014-04-30 NOTE — Progress Notes (Signed)
   Subjective:    Patient ID: Kevin Hughes, male    DOB: 06-11-1985, 29 y.o.   MRN: [id number]  HPI Patient in c/o low back pian- started all the sudden at lunch time yesterday- he denies any injury but does do a lot of bending and stooping at work. He also has a history of kidney stones. Last one being over a year ago. Denies any dysuria, frequency and urgency.    Review of Systems  Constitutional: Negative.   HENT: Negative.   Respiratory: Negative.   Cardiovascular: Negative.   Genitourinary: Negative.   Neurological: Negative.   Psychiatric/Behavioral: Negative.   All other systems reviewed and are negative.      Objective:   Physical Exam  Constitutional: He is oriented to person, place, and time. He appears well-developed and well-nourished.  Cardiovascular: Normal rate, regular rhythm and normal heart sounds.   Pulmonary/Chest: Effort normal and breath sounds normal.  Genitourinary:  No CVA tenderness   Musculoskeletal:  FROM of lumbar spine with pain on flexion and rotation.  (-) SLR bil Motor strength and sensation distally intact  Neurological: He is alert and oriented to person, place, and time. He has normal reflexes. No cranial nerve deficit.  Skin: Skin is warm and dry.  Psychiatric: He has a normal mood and affect. His behavior is normal. Judgment and thought content normal.    BP 136/79 mmHg  Pulse 64  Temp(Src) 97.6 F (36.4 C) (Oral)  Ht 5\' 10"  (1.778 m)  Wt 190 lb 9.6 oz (86.456 kg)  BMI 27.35 kg/m2         Assessment & Plan:   1. Right-sided low back pain without sciatica    Meds ordered this encounter  Medications  . acetaminophen (TYLENOL) 325 MG tablet    Sig: Take 650 mg by mouth every 6 (six) hours as needed.  . cyclobenzaprine (FLEXERIL) 10 MG tablet    Sig: Take 1 tablet (10 mg total) by mouth 3 (three) times daily as needed for muscle spasms.    Dispense:  30 tablet    Refill:  1    Order Specific Question:  Supervising Provider      Answer:  Ernestina PennaMOORE, DONALD W [[id number]]  . naproxen (NAPROSYN) 500 MG tablet    Sig: Take 1 tablet (500 mg total) by mouth 2 (two) times daily with a meal.    Dispense:  60 tablet    Refill:  1    Order Specific Question:  Supervising Provider    Answer:  Ernestina PennaMOORE, DONALD W [[id number]]   Moist heat No heavy lifting No bending or stooping RTO prn  Mary-Margaret Daphine DeutscherMartin, FNP

## 2014-05-06 ENCOUNTER — Ambulatory Visit (INDEPENDENT_AMBULATORY_CARE_PROVIDER_SITE_OTHER): Payer: BLUE CROSS/BLUE SHIELD | Admitting: Family Medicine

## 2014-05-06 ENCOUNTER — Ambulatory Visit (INDEPENDENT_AMBULATORY_CARE_PROVIDER_SITE_OTHER): Payer: BLUE CROSS/BLUE SHIELD

## 2014-05-06 VITALS — BP 124/86 | HR 91 | Temp 98.0°F | Ht 70.0 in | Wt 196.4 lb

## 2014-05-06 DIAGNOSIS — R109 Unspecified abdominal pain: Secondary | ICD-10-CM

## 2014-05-06 DIAGNOSIS — M545 Low back pain, unspecified: Secondary | ICD-10-CM

## 2014-05-06 IMAGING — CR DG ABDOMEN 1V
1 series · 1 of 1 positions shown · non-contrast
Comparison: None

CLINICAL DATA: Constipation, RIGHT flank pain, history kidney
stones

EXAM:
ABDOMEN - 1 VIEW

[view not recorded]
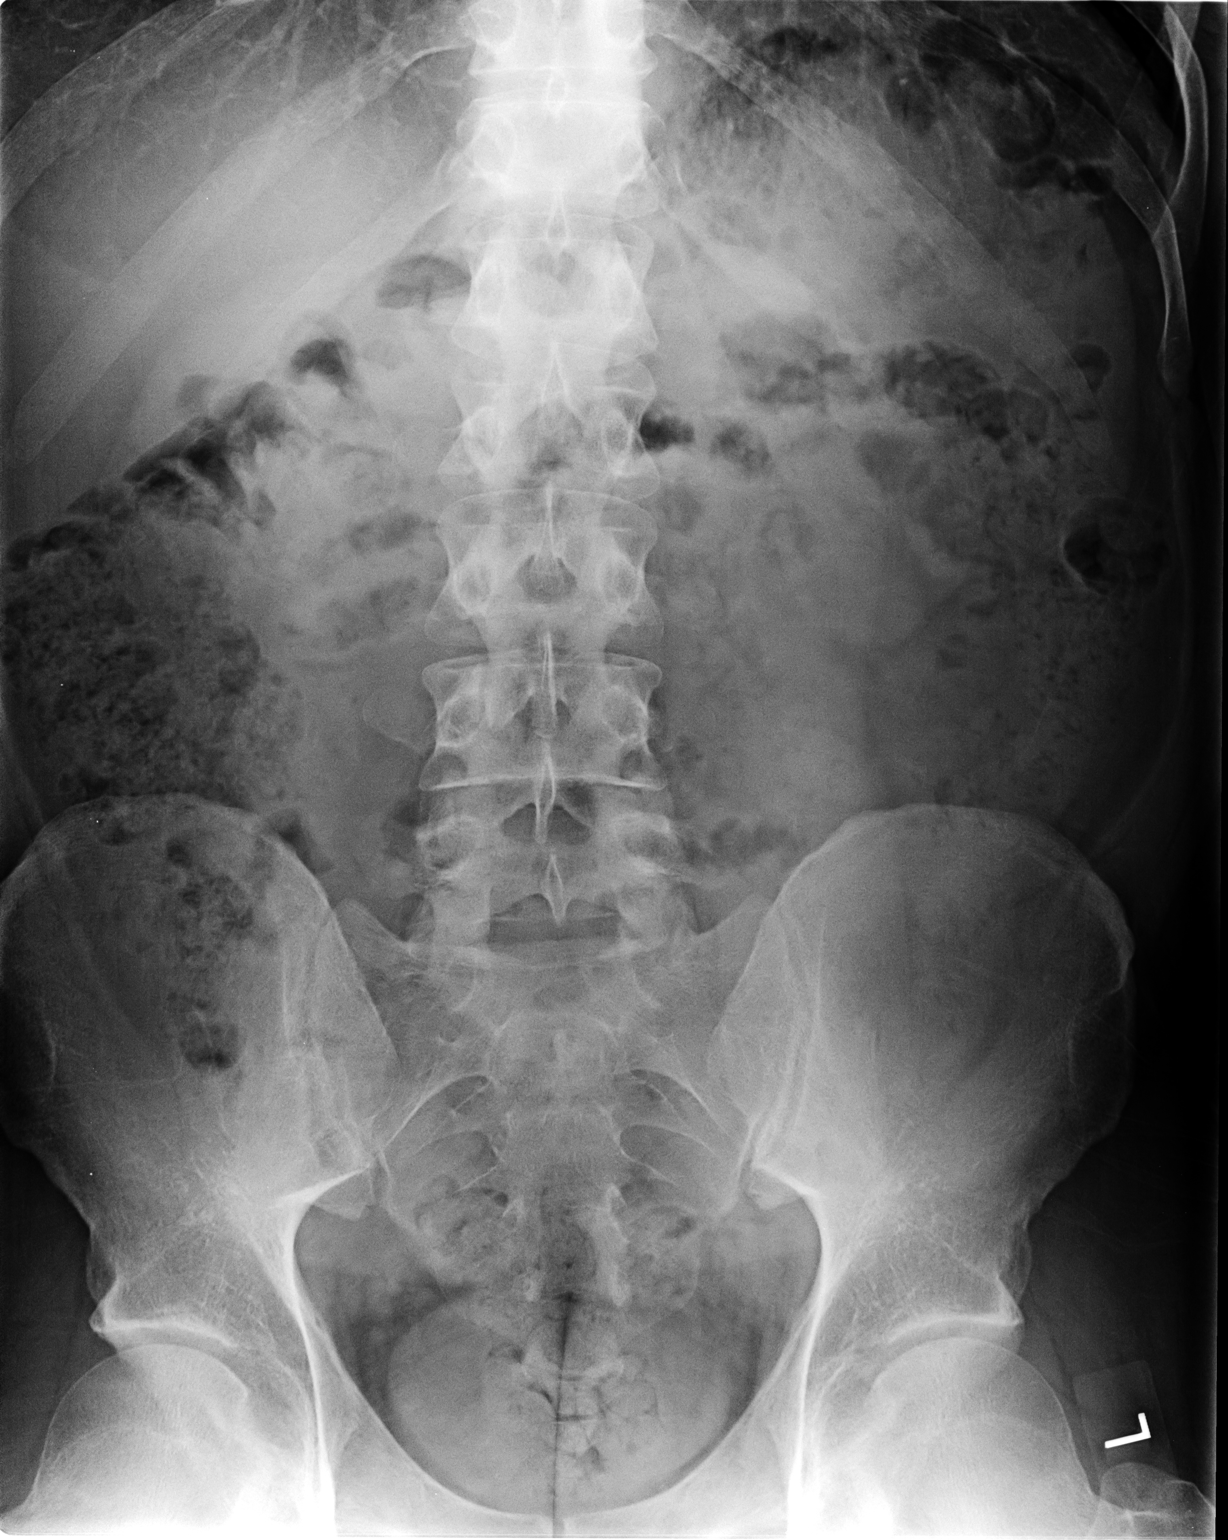

[1 of 1 positions shown; findings below may reference images not displayed]

FINDINGS: No urinary tract calcifications.

Increased stool throughout colon.

Nonobstructive bowel gas pattern.

No bowel dilatation or bowel wall thickening.

Bones unremarkable.
IMPRESSION: No urinary tract calcifications.

Increased stool throughout colon consistent with history of
constipation.

## 2014-05-06 MED ORDER — PREDNISONE 10 MG PO TABS: ORAL_TABLET | ORAL | Status: DC

## 2014-05-06 NOTE — Progress Notes (Signed)
Subjective:  Patient ID: Kevin Hughes, male    DOB: March 24, 1985  Age: 29 y.o. MRN: [id number]  CC: Back Pain   HPI Kevin Hughes presents for pain in the lower back. He described it initially was flank pain, however, it is a bit lower and central and the renal fossa. It is affected by bending over and twisting. There is no dysuria or frequency or urgency. Denies change in bowel habits. One exception is some harder bowel movements and slight constipation perhaps. No nausea vomiting and diarrhea. Onset was one week ago. Relief with the cyclobenzaprine and naproxen has been minimal  History Kevin Hughes has no past medical history on file.   He has no past surgical history on file.   His family history is not on file.He reports that he has never smoked. He does not have any smokeless tobacco history on file. He reports that he does not drink alcohol or use illicit drugs.  Current Outpatient Prescriptions on File Prior to Visit  Medication Sig Dispense Refill  . acetaminophen (TYLENOL) 325 MG tablet Take 650 mg by mouth every 6 (six) hours as needed.    . cyclobenzaprine (FLEXERIL) 10 MG tablet Take 1 tablet (10 mg total) by mouth 3 (three) times daily as needed for muscle spasms. 30 tablet 1  . naproxen (NAPROSYN) 500 MG tablet Take 1 tablet (500 mg total) by mouth 2 (two) times daily with a meal. 60 tablet 1   No current facility-administered medications on file prior to visit.    ROS Review of Systems  Constitutional: Negative for fever, chills, diaphoresis and unexpected weight change.  HENT: Negative for congestion, hearing loss, rhinorrhea, sore throat and trouble swallowing.   Respiratory: Negative for cough, chest tightness, shortness of breath and wheezing.   Gastrointestinal: Negative for nausea, vomiting, abdominal pain, diarrhea, constipation and abdominal distention.  Endocrine: Negative for cold intolerance and heat intolerance.  Genitourinary: Negative for dysuria, hematuria and  flank pain.  Musculoskeletal: Positive for back pain. Negative for joint swelling and arthralgias.  Skin: Negative for rash.  Neurological: Negative for dizziness and headaches.  Psychiatric/Behavioral: Negative for dysphoric mood, decreased concentration and agitation. The patient is not nervous/anxious.     Objective:  BP 124/86 mmHg  Pulse 91  Temp(Src) 98 F (36.7 C) (Oral)  Ht  (1.778 m)  Wt 196 lb 6.4 oz (89.086 kg)  BMI 28.18 kg/m2  BP Readings from Last 3 Encounters:  05/06/14 124/86  04/30/14 136/79  11/23/13 119/78    Wt Readings from Last 3 Encounters:  05/06/14 196 lb 6.4 oz (89.086 kg)  04/30/14 190 lb 9.6 oz (86.456 kg)  11/23/13 187 lb 11.2 oz (85.14 kg)     Physical Exam  Constitutional: He is oriented to person, place, and time. He appears well-developed and well-nourished. No distress.  HENT:  Head: Normocephalic and atraumatic.  Right Ear: External ear normal.  Left Ear: External ear normal.  Nose: Nose normal.  Mouth/Throat: Oropharynx is clear and moist.  Eyes: Conjunctivae and EOM are normal. Pupils are equal, round, and reactive to light.  Neck: Normal range of motion. Neck supple. No thyromegaly present.  Cardiovascular: Normal rate, regular rhythm and normal heart sounds.   No murmur heard. Pulmonary/Chest: Effort normal and breath sounds normal. No respiratory distress. He has no wheezes. He has no rales.  Abdominal: Soft. Bowel sounds are normal. He exhibits no distension. There is no tenderness.  Musculoskeletal: He exhibits tenderness (right paraspinous lower lumbar musculature).  Lymphadenopathy:  He has no cervical adenopathy.  Neurological: He is alert and oriented to person, place, and time. He has normal reflexes.  Skin: Skin is warm and dry.  Psychiatric: He has a normal mood and affect. His behavior is normal. Judgment and thought content normal.    No results found for: HGBA1C  Lab Results  Component Value Date   WBC  12.1* 07/03/2009   HGB 15.5 07/03/2009   HCT 44.1 07/03/2009   PLT 260 07/03/2009   GLUCOSE 121* 07/03/2009   ALT 72* 07/03/2009   AST 71* 07/03/2009   NA 138 07/03/2009   K 3.8 07/03/2009   CL 104 07/03/2009   CREATININE 0.97 07/03/2009   BUN 14 07/03/2009   CO2 27 07/03/2009   INR 1.04 07/03/2009    Ct Abdomen Pelvis Wo Contrast  12/17/2012   CLINICAL DATA:  Left flank pain  EXAM: CT ABDOMEN AND PELVIS WITHOUT CONTRAST  TECHNIQUE: Multidetector CT imaging of the abdomen and pelvis was performed following the standard protocol without intravenous contrast.  COMPARISON:  07/03/2009  FINDINGS: The lung bases are free of acute infiltrate or sizable effusion.  The liver, spleen, adrenal glands, gallbladder and pancreas are within normal limits. The kidneys are well visualized bilaterally without evidence of renal calculi. Mild fullness of the left renal collecting system and left ureter are noted when compared with that of the right. This extends to the urinary bladder without definitive calculus. This could be related to a poorly calcified stone or edema from recently passed stone. Correlation with the patient's clinical history is recommended.  The appendix is well visualized and within normal limits. The colon shows very mild diverticular change without diverticulitis. The bladder is decompressed. No pelvic mass lesion is noted. No acute bony abnormality is seen.  IMPRESSION: Mild fullness of the left renal collecting system and ureter. This may be related to edema from recently passed stone or a poorly calcified stone.  No other focal abnormality is seen.   Electronically Signed   By: Alcide CleverMark  Lukens   On: 12/17/2012 16:28    Assessment & Plan:   Kevin Hughes was seen today for back pain.  Diagnoses and all orders for this visit:  Right-sided low back pain without sciatica Orders: -     Ambulatory referral to Physical Therapy  Right flank pain Orders: -     DG Abd 1 View  Other orders -      predniSONE (DELTASONE) 10 MG tablet; Take 5 daily for 3 days followed by 4,3,2 and 1 for 3 days each.   I am having Kevin Hughes start on predniSONE. I am also having him maintain his acetaminophen, cyclobenzaprine, and naproxen.  Meds ordered this encounter  Medications  . predniSONE (DELTASONE) 10 MG tablet    Sig: Take 5 daily for 3 days followed by 4,3,2 and 1 for 3 days each.    Dispense:  45 tablet    Refill:  0    Follow-up: No Follow-up on file.  Mechele ClaudeWarren Doak Mah, M.D.

## 2014-05-06 NOTE — Patient Instructions (Addendum)
Use glycolax, one cap daily until bowels move regularly. As needed afterward. Do not take the naproxen until you finish the prednisone

## 2014-05-09 ENCOUNTER — Encounter: Payer: Self-pay | Admitting: Family Medicine

## 2014-05-18 ENCOUNTER — Encounter: Payer: Self-pay | Admitting: Family Medicine

## 2014-05-18 ENCOUNTER — Ambulatory Visit (INDEPENDENT_AMBULATORY_CARE_PROVIDER_SITE_OTHER): Payer: BLUE CROSS/BLUE SHIELD | Admitting: Family Medicine

## 2014-05-18 VITALS — BP 139/82 | Temp 97.8°F | Ht 70.0 in | Wt 189.6 lb

## 2014-05-18 DIAGNOSIS — J0111 Acute recurrent frontal sinusitis: Secondary | ICD-10-CM

## 2014-05-18 MED ORDER — PSEUDOEPHEDRINE-GUAIFENESIN ER 120-1200 MG PO TB12: 1.0000 | ORAL_TABLET | Freq: Two times a day (BID) | ORAL | Status: DC

## 2014-05-18 MED ORDER — BETAMETHASONE SOD PHOS & ACET 6 (3-3) MG/ML IJ SUSP
6.0000 mg | Freq: Once | INTRAMUSCULAR | Status: AC
  Administered 2014-05-18: 6 mg via INTRAMUSCULAR

## 2014-05-18 MED ORDER — AMOXICILLIN-POT CLAVULANATE 875-125 MG PO TABS
1.0000 | ORAL_TABLET | Freq: Two times a day (BID) | ORAL | Status: DC
Start: 2014-05-18 — End: 2014-05-23

## 2014-05-18 NOTE — Progress Notes (Signed)
Subjective:  Patient ID: Kevin Hughes, male    DOB: [birth year]  Age: 29 y.o. MRN: [id number]  CC: Sinusitis   HPI Kevin Hughes presents for 2 days congestion, frontal HA, pressure and feeling of dizziness Sinus Pain: Patient complains of congestion, facial pain, headache described as frontal, moderate pressure, nasal congestion, no  fever and non productive cough. Symptoms include post nasal drip and sinus pressur Onset of symptoms was 2 days ago, gradually worsening since that time. drinking moderate amounts of fluids.  Past history is significant for sinus infections and migraines.   History Kevin Hughes has no past medical history on file.   He has no past surgical history on file.   His family history is not on file.He reports that he has never smoked. He does not have any smokeless tobacco history on file. He reports that he does not drink alcohol or use illicit drugs.  Current Outpatient Prescriptions on File Prior to Visit  Medication Sig Dispense Refill  . acetaminophen (TYLENOL) 325 MG tablet Take 650 mg by mouth every 6 (six) hours as needed.    . cyclobenzaprine (FLEXERIL) 10 MG tablet Take 1 tablet (10 mg total) by mouth 3 (three) times daily as needed for muscle spasms. 30 tablet 1  . naproxen (NAPROSYN) 500 MG tablet Take 1 tablet (500 mg total) by mouth 2 (two) times daily with a meal. 60 tablet 1  . predniSONE (DELTASONE) 10 MG tablet Take 5 daily for 3 days followed by 4,3,2 and 1 for 3 days each. 45 tablet 0   No current facility-administered medications on file prior to visit.    ROS Review of Systems  Constitutional: Negative for fever, chills, activity change and appetite change.  HENT: Positive for congestion, postnasal drip, rhinorrhea and sinus pressure. Negative for ear discharge, ear pain, hearing loss, nosebleeds, sneezing and trouble swallowing.   Respiratory: Negative for chest tightness and shortness of breath.   Cardiovascular: Negative for chest pain and  palpitations.  Skin: Negative for rash.    Objective:  BP 139/82 mmHg  Temp(Src) 97.8 F (36.6 C) (Oral)  Ht 5\' 10"  (1.778 m)  Wt 189 lb 9.6 oz (86.002 kg)  BMI 27.20 kg/m2  BP Readings from Last 3 Encounters:  05/18/14 139/82  05/06/14 124/86  04/30/14 136/79    Wt Readings from Last 3 Encounters:  05/18/14 189 lb 9.6 oz (86.002 kg)  05/06/14 196 lb 6.4 oz (89.086 kg)  04/30/14 190 lb 9.6 oz (86.456 kg)     Physical Exam  Constitutional: He appears well-developed and well-nourished.  HENT:  Head: Normocephalic and atraumatic.  Right Ear: Tympanic membrane and external ear normal. No decreased hearing is noted.  Left Ear: Tympanic membrane and external ear normal. No decreased hearing is noted.  Nose: Mucosal edema present. Right sinus exhibits maxillary sinus tenderness and frontal sinus tenderness. Left sinus exhibits maxillary sinus tenderness and frontal sinus tenderness.  Mouth/Throat: No oropharyngeal exudate or posterior oropharyngeal erythema.  Neck: No Brudzinski's sign noted.  Pulmonary/Chest: Breath sounds normal. No respiratory distress.  Lymphadenopathy:       Head (right side): No preauricular adenopathy present.       Head (left side): No preauricular adenopathy present.       Right cervical: No superficial cervical adenopathy present.      Left cervical: No superficial cervical adenopathy present.    No results found for: HGBA1C  Lab Results  Component Value Date   WBC 12.1* 07/03/2009   HGB 15.5  07/03/2009   HCT 44.1 07/03/2009   PLT 260 07/03/2009   GLUCOSE 121* 07/03/2009   ALT 72* 07/03/2009   AST 71* 07/03/2009   NA 138 07/03/2009   K 3.8 07/03/2009   CL 104 07/03/2009   CREATININE 0.97 07/03/2009   BUN 14 07/03/2009   CO2 27 07/03/2009   INR 1.04 07/03/2009    Ct Abdomen Pelvis Wo Contrast  12/17/2012   CLINICAL DATA:  Left flank pain  EXAM: CT ABDOMEN AND PELVIS WITHOUT CONTRAST  TECHNIQUE: Multidetector CT imaging of the abdomen  and pelvis was performed following the standard protocol without intravenous contrast.  COMPARISON:  07/03/2009  FINDINGS: The lung bases are free of acute infiltrate or sizable effusion.  The liver, spleen, adrenal glands, gallbladder and pancreas are within normal limits. The kidneys are well visualized bilaterally without evidence of renal calculi. Mild fullness of the left renal collecting system and left ureter are noted when compared with that of the right. This extends to the urinary bladder without definitive calculus. This could be related to a poorly calcified stone or edema from recently passed stone. Correlation with the patient's clinical history is recommended.  The appendix is well visualized and within normal limits. The colon shows very mild diverticular change without diverticulitis. The bladder is decompressed. No pelvic mass lesion is noted. No acute bony abnormality is seen.  IMPRESSION: Mild fullness of the left renal collecting system and ureter. This may be related to edema from recently passed stone or a poorly calcified stone.  No other focal abnormality is seen.   Electronically Signed   By: Alcide Clever   On: 12/17/2012 16:28    Assessment & Plan:   Phenix was seen today for sinusitis.  Diagnoses and all orders for this visit:  Acute recurrent frontal sinusitis Orders: -     betamethasone acetate-betamethasone sodium phosphate (CELESTONE) injection 6 mg; Inject 1 mL (6 mg total) into the muscle once.  Other orders -     amoxicillin-clavulanate (AUGMENTIN) 875-125 MG per tablet; Take 1 tablet by mouth 2 (two) times daily. Take all of this medication -     Pseudoephedrine-Guaifenesin [phone] MG TB12; Take 1 tablet by mouth 2 (two) times daily. For congeestion   I am having Kevin Hughes start on amoxicillin-clavulanate and Pseudoephedrine-Guaifenesin. I am also having him maintain his acetaminophen, cyclobenzaprine, naproxen, and predniSONE. We administered betamethasone  acetate-betamethasone sodium phosphate.  Meds ordered this encounter  Medications  . amoxicillin-clavulanate (AUGMENTIN) 875-125 MG per tablet    Sig: Take 1 tablet by mouth 2 (two) times daily. Take all of this medication    Dispense:  20 tablet    Refill:  0  . Pseudoephedrine-Guaifenesin [phone] MG TB12    Sig: Take 1 tablet by mouth 2 (two) times daily. For congeestion    Dispense:  20 each    Refill:  0  . betamethasone acetate-betamethasone sodium phosphate (CELESTONE) injection 6 mg    Sig:      Follow-up: Return if symptoms worsen or fail to improve.  Mechele Claude, M.D.

## 2014-05-23 ENCOUNTER — Telehealth: Payer: Self-pay | Admitting: *Deleted

## 2014-05-23 ENCOUNTER — Ambulatory Visit (INDEPENDENT_AMBULATORY_CARE_PROVIDER_SITE_OTHER): Payer: BLUE CROSS/BLUE SHIELD | Admitting: Family

## 2014-05-23 ENCOUNTER — Encounter: Payer: Self-pay | Admitting: Family

## 2014-05-23 VITALS — BP 130/91 | HR 78 | Temp 97.5°F | Ht 70.0 in | Wt 188.6 lb

## 2014-05-23 DIAGNOSIS — J019 Acute sinusitis, unspecified: Secondary | ICD-10-CM | POA: Diagnosis not present

## 2014-05-23 MED ORDER — LEVOFLOXACIN 500 MG PO TABS: 500.0000 mg | ORAL_TABLET | Freq: Every day | ORAL | Status: DC

## 2014-05-23 NOTE — Progress Notes (Signed)
   Subjective:    Patient ID: Kevin Hughes, male    DOB: 12-Sep-1985, 29 y.o.   MRN: [id number]  Sinusitis This is a new problem. The current episode started in the past 7 days. The problem has been waxing and waning since onset. There has been no fever. The fever has been present for less than 1 day. The pain is mild. Associated symptoms include congestion, ear pain, headaches and sinus pressure. Pertinent negatives include no coughing, shortness of breath, sneezing or sore throat. Past treatments include antibiotics, lying down and oral decongestants. The treatment provided mild relief.   Pt was seen on 05/18/14 and was started on Augmentin and told to start on mucinex.     Review of Systems  Constitutional: Negative.   HENT: Positive for congestion, ear pain and sinus pressure. Negative for sneezing and sore throat.   Respiratory: Negative.  Negative for cough and shortness of breath.   Cardiovascular: Negative.   Gastrointestinal: Negative.   Endocrine: Negative.   Genitourinary: Negative.   Musculoskeletal: Negative.   Neurological: Positive for headaches.  Hematological: Negative.   Psychiatric/Behavioral: Negative.   All other systems reviewed and are negative.      Objective:   Physical Exam  Constitutional: He is oriented to person, place, and time. He appears well-developed and well-nourished. No distress.  HENT:  Head: Normocephalic.  Right Ear: External ear normal.  Left Ear: External ear normal.  Nose: Nose normal.  Mouth/Throat: Oropharynx is clear and moist.  Eyes: Pupils are equal, round, and reactive to light. Right eye exhibits no discharge. Left eye exhibits no discharge.  Neck: Normal range of motion. Neck supple. No thyromegaly present.  Cardiovascular: Normal rate, regular rhythm, normal heart sounds and intact distal pulses.   No murmur heard. Pulmonary/Chest: Effort normal and breath sounds normal. No respiratory distress. He has no wheezes.  Abdominal:  Soft. Bowel sounds are normal. He exhibits no distension. There is no tenderness.  Musculoskeletal: Normal range of motion. He exhibits no edema or tenderness.  Neurological: He is alert and oriented to person, place, and time. He has normal reflexes. No cranial nerve deficit.  Skin: Skin is warm and dry. No rash noted. No erythema.  Psychiatric: He has a normal mood and affect. His behavior is normal. Judgment and thought content normal.  Vitals reviewed.     BP 130/91 mmHg  Pulse 78  Temp(Src) 97.5 F (36.4 C) (Oral)  Ht 5\' 10"  (1.778 m)  Wt 188 lb 9.6 oz (85.548 kg)  BMI 27.06 kg/m2     Assessment & Plan:  1. Acute sinusitis, recurrence not specified, unspecified location -- Take meds as prescribed - Use a cool mist humidifier  -Use saline nose sprays frequently -Saline irrigations of the nose can be very helpful if done frequently.  * 4X daily for 1 week*  * Use of a nettie pot can be helpful with this. Follow directions with this* -Force fluids -For any cough or congestion  Use plain Mucinex- regular strength or max strength is fine   * Children- consult with Pharmacist for dosing -For fever or aces or pains- take tylenol or ibuprofen appropriate for age and weight.  * for fevers greater than 101 orally you may alternate ibuprofen and tylenol every  3 hours. -Throat lozenges if help - levofloxacin (LEVAQUIN) 500 MG tablet; Take 1 tablet (500 mg total) by mouth daily.  Dispense: 7 tablet; Refill: 0  Jannifer Rodneyhristy Melodee Lupe, FNP

## 2014-05-23 NOTE — Patient Instructions (Signed)
Sinusitis Sinusitis is redness, soreness, and inflammation of the paranasal sinuses. Paranasal sinuses are air pockets within the bones of your face (beneath the eyes, the middle of the forehead, or above the eyes). In healthy paranasal sinuses, mucus is able to drain out, and air is able to circulate through them by way of your nose. However, when your paranasal sinuses are inflamed, mucus and air can become trapped. This can allow bacteria and other germs to grow and cause infection. Sinusitis can develop quickly and last only a short time (acute) or continue over a long period (chronic). Sinusitis that lasts for more than 12 weeks is considered chronic.  CAUSES  Causes of sinusitis include:  Allergies.  Structural abnormalities, such as displacement of the cartilage that separates your nostrils (deviated septum), which can decrease the air flow through your nose and sinuses and affect sinus drainage.  Functional abnormalities, such as when the small hairs (cilia) that line your sinuses and help remove mucus do not work properly or are not present. SIGNS AND SYMPTOMS  Symptoms of acute and chronic sinusitis are the same. The primary symptoms are pain and pressure around the affected sinuses. Other symptoms include:  Upper toothache.  Earache.  Headache.  Bad breath.  Decreased sense of smell and taste.  A cough, which worsens when you are lying flat.  Fatigue.  Fever.  Thick drainage from your nose, which often is green and may contain pus (purulent).  Swelling and warmth over the affected sinuses. DIAGNOSIS  Your health care provider will perform a physical exam. During the exam, your health care provider may:  Look in your nose for signs of abnormal growths in your nostrils (nasal polyps).  Tap over the affected sinus to check for signs of infection.  View the inside of your sinuses (endoscopy) using an imaging device that has a light attached (endoscope). If your health  care provider suspects that you have chronic sinusitis, one or more of the following tests may be recommended:  Allergy tests.  Nasal culture. A sample of mucus is taken from your nose, sent to a lab, and screened for bacteria.  Nasal cytology. A sample of mucus is taken from your nose and examined by your health care provider to determine if your sinusitis is related to an allergy. TREATMENT  Most cases of acute sinusitis are related to a viral infection and will resolve on their own within 10 days. Sometimes medicines are prescribed to help relieve symptoms (pain medicine, decongestants, nasal steroid sprays, or saline sprays).  However, for sinusitis related to a bacterial infection, your health care provider will prescribe antibiotic medicines. These are medicines that will help kill the bacteria causing the infection.  Rarely, sinusitis is caused by a fungal infection. In theses cases, your health care provider will prescribe antifungal medicine. For some cases of chronic sinusitis, surgery is needed. Generally, these are cases in which sinusitis recurs more than 3 times per year, despite other treatments. HOME CARE INSTRUCTIONS   Drink plenty of water. Water helps thin the mucus so your sinuses can drain more easily.  Use a humidifier.  Inhale steam 3 to 4 times a day (for example, sit in the bathroom with the shower running).  Apply a warm, moist washcloth to your face 3 to 4 times a day, or as directed by your health care provider.  Use saline nasal sprays to help moisten and clean your sinuses.  Take medicines only as directed by your health care provider.    If you were prescribed either an antibiotic or antifungal medicine, finish it all even if you start to feel better. SEEK IMMEDIATE MEDICAL CARE IF:  You have increasing pain or severe headaches.  You have nausea, vomiting, or drowsiness.  You have swelling around your face.  You have vision problems.  You have a stiff  neck.  You have difficulty breathing. MAKE SURE YOU:   Understand these instructions.  Will watch your condition.  Will get help right away if you are not doing well or get worse. Document Released: 03/04/2005 Document Revised: 07/19/2013 Document Reviewed: 03/19/2011 Exit[employer] Patient Information 2015 [employer]. This information is not intended to replace advice given to you by your health care provider. Make sure you discuss any questions you have with your health care provider.  - Take meds as prescribed - Use a cool mist humidifier  -Use saline nose sprays frequently -Saline irrigations of the nose can be very helpful if done frequently.  * 4X daily for 1 week*  * Use of a nettie pot can be helpful with this. Follow directions with this* -Force fluids -For any cough or congestion  Use plain Mucinex- regular strength or max strength is fine   * Children- consult with Pharmacist for dosing -For fever or aces or pains- take tylenol or ibuprofen appropriate for age and weight.  * for fevers greater than 101 orally you may alternate ibuprofen and tylenol every  3 hours. -Throat lozenges if help   Ario Mcdiarmid, FNP  

## 2014-05-23 NOTE — Telephone Encounter (Signed)
Wife states that he is taking antibiotic and normally feels better pretty quick but feels worse and is unable to get off the couch. States that he has never been like this. Should another anitibiotic be sent in or what would you advise

## 2014-05-24 ENCOUNTER — Other Ambulatory Visit: Payer: Self-pay | Admitting: *Deleted

## 2014-05-24 DIAGNOSIS — J329 Chronic sinusitis, unspecified: Secondary | ICD-10-CM

## 2014-05-25 ENCOUNTER — Ambulatory Visit
Admission: RE | Admit: 2014-05-25 | Discharge: 2014-05-25 | Disposition: A | Discharge status: Home / Self Care | Payer: BLUE CROSS/BLUE SHIELD | Source: Ambulatory Visit | Attending: Otolaryngology | Admitting: Otolaryngology

## 2014-05-25 ENCOUNTER — Other Ambulatory Visit (INDEPENDENT_AMBULATORY_CARE_PROVIDER_SITE_OTHER): Payer: Self-pay | Admitting: Otolaryngology

## 2014-05-25 DIAGNOSIS — J328 Other chronic sinusitis: Secondary | ICD-10-CM

## 2014-05-25 IMAGING — CT CT MAXILLOFACIAL W/O CM
3 series · 17 of 40 positions shown, 19 images · non-contrast
Comparison: 07/03/2009

CLINICAL DATA: Frontal headache and sinus drainage, 2 weeks
duration.

EXAM:
CT MAXILLOFACIAL WITHOUT CONTRAST
TECHNIQUE: Multidetector CT imaging of the maxillofacial structures was
performed. Multiplanar CT image reconstructions were also generated.
A small metallic BB was placed on the right temple in order to
reliably differentiate right from left.

[Series 3: axial soft 1.25 · axial · 0.49mm/px · z∈[-39,+85]mm · 6 of 173 slices shown]
[im 19/173  brain]
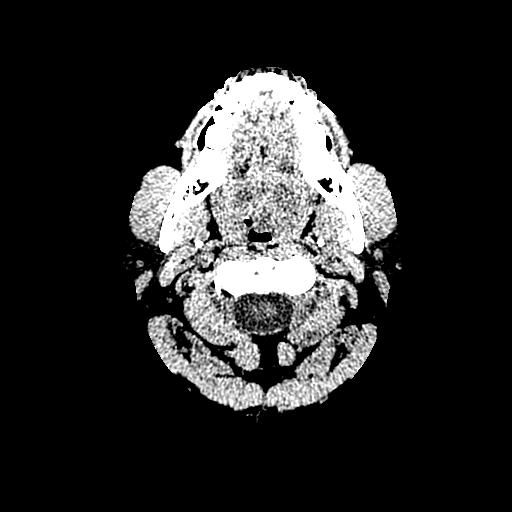
[im 37/173  brain]
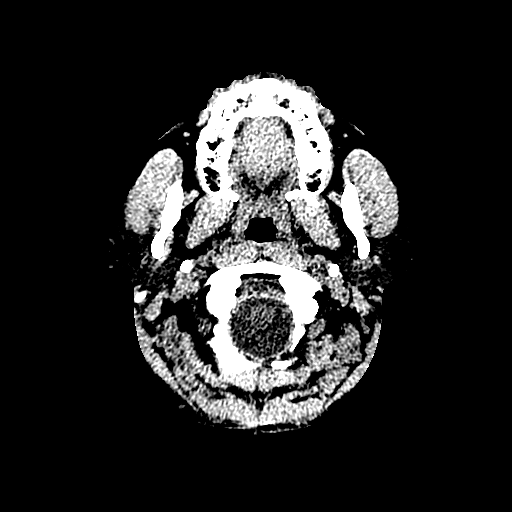
[im 55/173  brain]
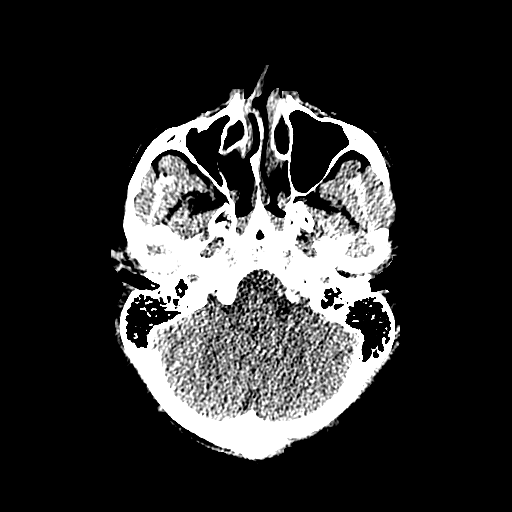
[im 73/173  brain]
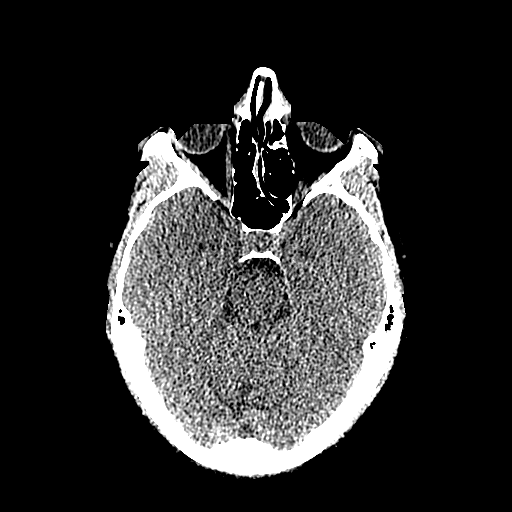
[im 100/173  brain]
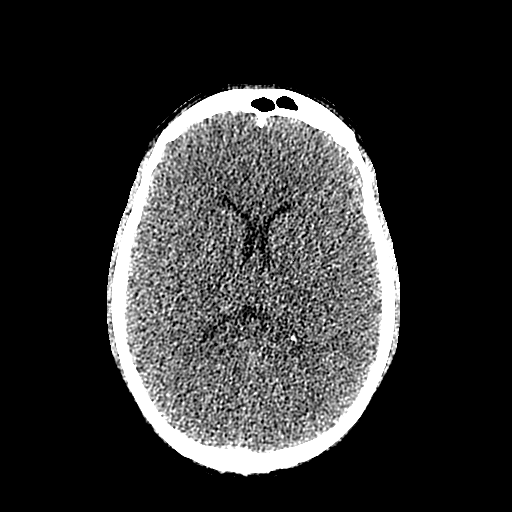
[im 118/173  brain]
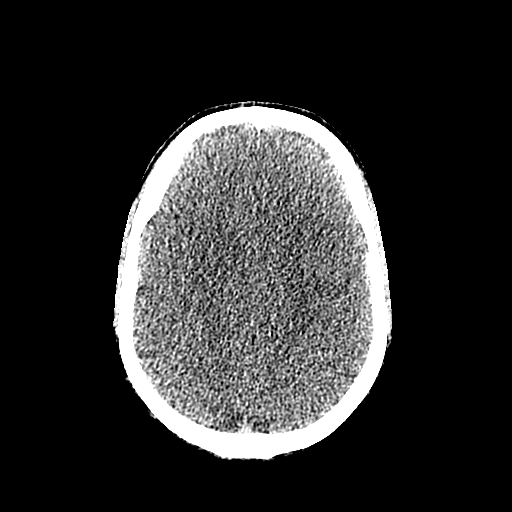

[Series 601: coronal facial · coronal · 0.49mm/px · 3 of 116 slices shown]
[im 39/116  bone]
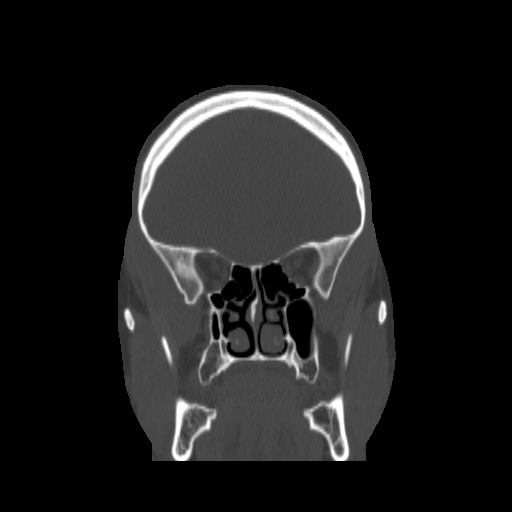
[im 52/116  bone]
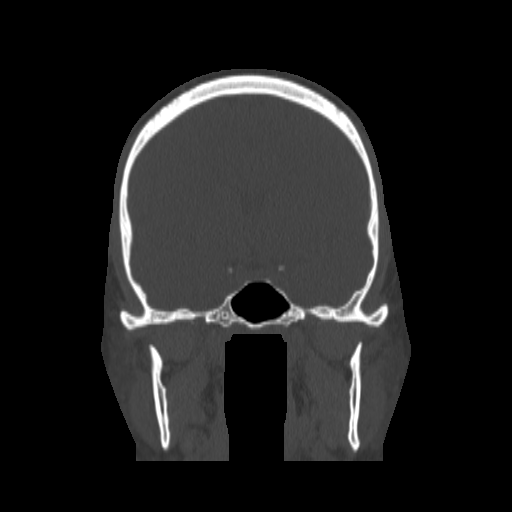
[im 64/116  bone]
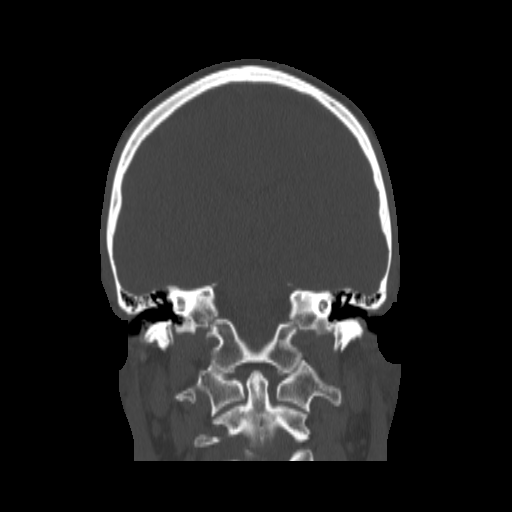

[Series 602: sagittal facial · sagittal · 0.49mm/px · 8 of 89 slices shown, 10 images]
[im 10/89  brain]
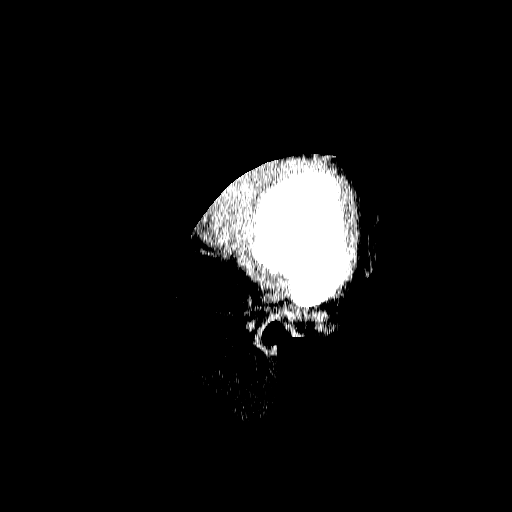
[im 10/89  bone]
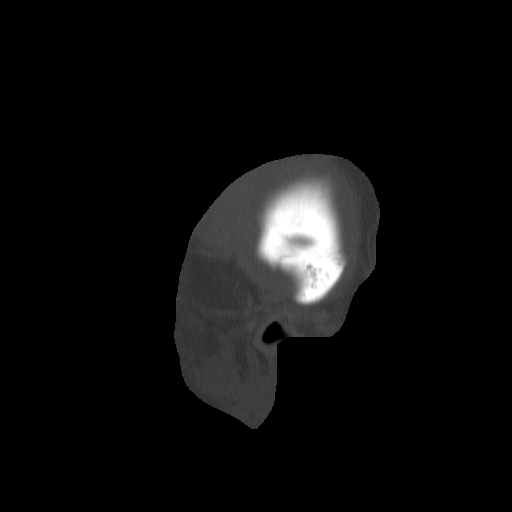
[im 20/89  bone]
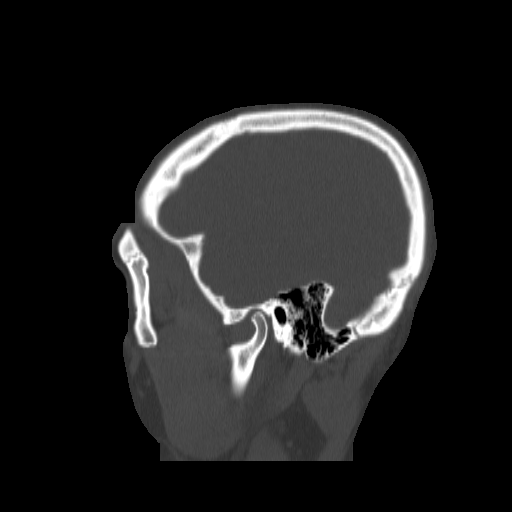
[im 30/89  bone]
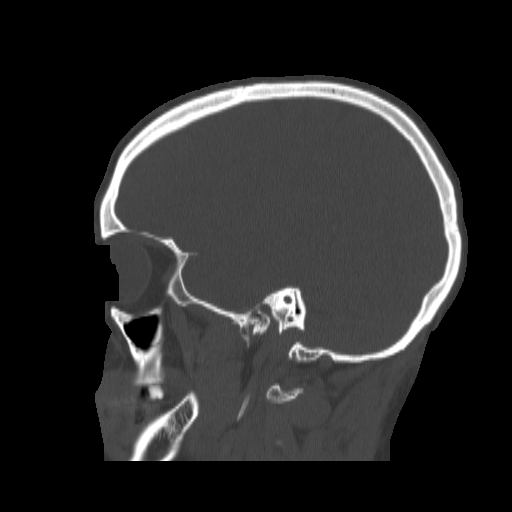
[im 40/89  bone]
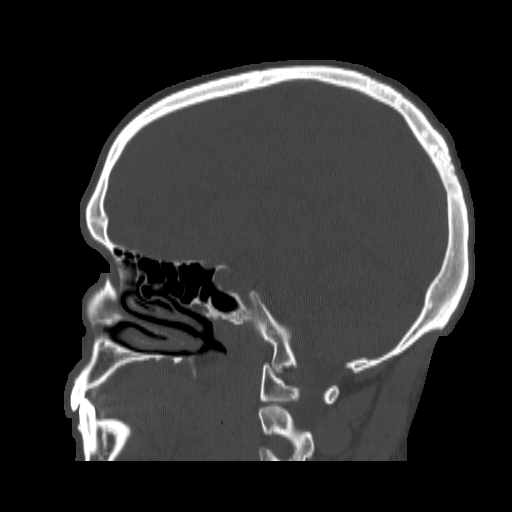
[im 49/89  brain]
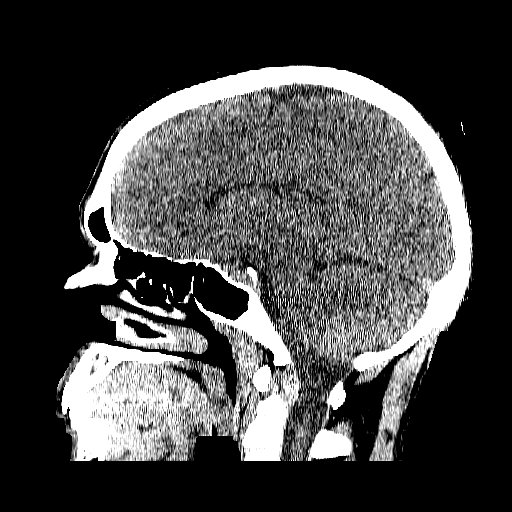
[im 49/89  bone]
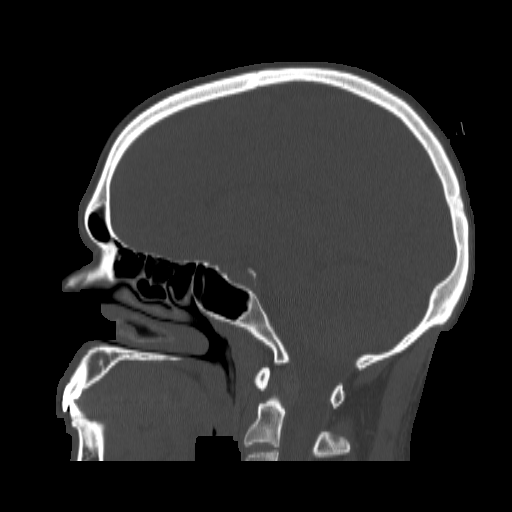
[im 59/89  bone]
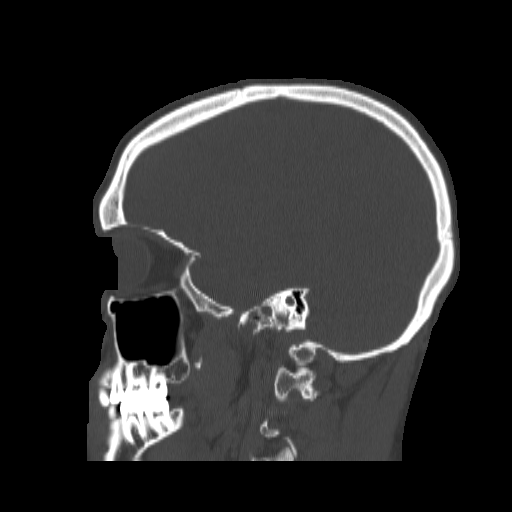
[im 69/89  bone]
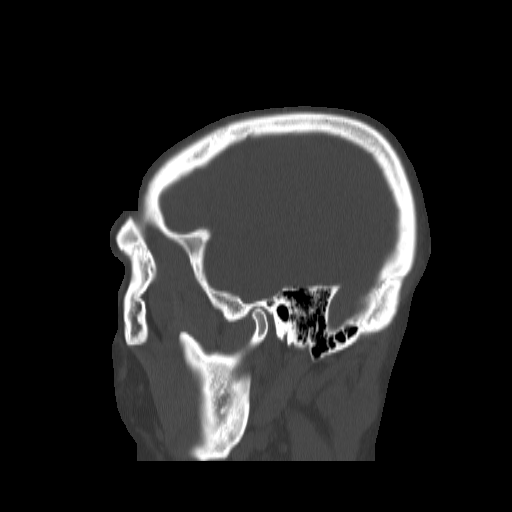
[im 79/89  bone]
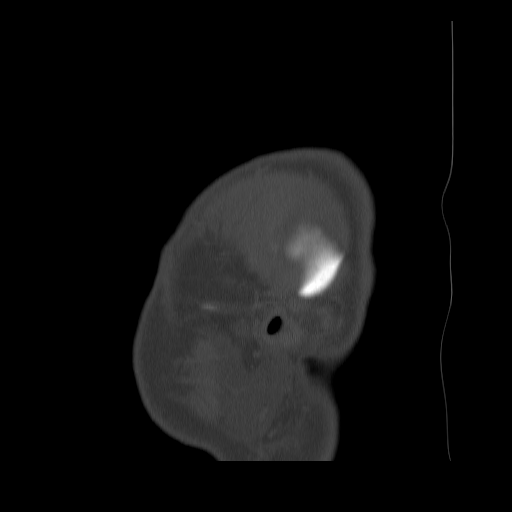

[17 of 40 positions shown; findings below may reference images not displayed]

FINDINGS: Right frontal sinus is aplastic. Left frontal sinus is clear. The
right maxillary sinus is hypoplastic but clear. No mucosal
thickening or fluid. Left maxillary sinus is normally developed and
clear. The sphenoid sinus is clear. There is pneumatization of the
clinoid process on the right.

Nasal septum bows 3 mm towards the right. Ostiomeatal complex on the
left is normal. On the right, the infundibulum is quite narrow,
because of rightward deviation of the nasal structures on the right
in response to the hypoplastic maxillary sinus.
IMPRESSION: No evidence of acute or chronic sinus inflammatory disease.

Aplastic right frontal sinus. Hypoplastic right maxillary sinus.
Other secondary anatomic variations because of those findings. See
above discussion.

## 2015-04-10 ENCOUNTER — Encounter: Payer: Self-pay | Admitting: Pediatrics

## 2015-04-10 ENCOUNTER — Ambulatory Visit (INDEPENDENT_AMBULATORY_CARE_PROVIDER_SITE_OTHER): Payer: BLUE CROSS/BLUE SHIELD | Admitting: Pediatrics

## 2015-04-10 VITALS — BP 127/81 | HR 82 | Temp 98.1°F | Ht 70.0 in | Wt 192.2 lb

## 2015-04-10 DIAGNOSIS — J069 Acute upper respiratory infection, unspecified: Secondary | ICD-10-CM | POA: Diagnosis not present

## 2015-04-10 NOTE — Patient Instructions (Addendum)
  Netipot with distilled water 2-3 times a day to clear out sinuses Flonase steroid nasal spray twice a day Ibuprofen  three times a day Lots of fluids  If getting worse in another 6-7 days let me know

## 2015-04-10 NOTE — Progress Notes (Signed)
    Subjective:    Patient ID: Kevin Hughes, male    DOB: 1985-06-22, 30 y.o.   MRN: [id number]  CC: Nasal Congestion   HPI: Kevin Hughes is a 30 y.o. male presenting for Nasal Congestion  Started yesterday with congestion No fevers Feels like previous infections have been Appetite normal Taking mucinex D at home, helps some Symptoms worse today   Depression screen [facility] 2/9 04/10/2015 05/18/2014 05/06/2014 11/23/2013  Decreased Interest 0 0 0 0  Down, Depressed, Hopeless 0 0 0 0  PHQ - 2 Score 0 0 0 0     Relevant past medical, surgical, family and social history reviewed and updated as indicated. Interim medical history since our last visit reviewed. Allergies and medications reviewed and updated.    ROS: Per HPI unless specifically indicated above  History  Smoking status  . Never Smoker   Smokeless tobacco  . Not on file    Past Medical History Patient Active Problem List   Diagnosis Date Noted  . Lumbar pain 05/06/2014    Current Outpatient Prescriptions  Medication Sig Dispense Refill  . acetaminophen (TYLENOL) 325 MG tablet Take 650 mg by mouth every 6 (six) hours as needed.     No current facility-administered medications for this visit.       Objective:    BP 127/81 mmHg  Pulse 82  Temp(Src) 98.1 F (36.7 C) (Oral)  Ht  (1.778 m)  Wt 192 lb 3.2 oz (87.181 kg)  BMI 27.58 kg/m2  Wt Readings from Last 3 Encounters:  04/10/15 192 lb 3.2 oz (87.181 kg)  05/23/14 188 lb 9.6 oz (85.548 kg)  05/18/14 189 lb 9.6 oz (86.002 kg)     Gen: NAD, alert, cooperative with exam, NCAT, slightly congested EYES: EOMI, no scleral injection or icterus ENT:  TMs pearly gray b/l, OP without erythema, mildly tender over sinuses b/l LYMPH: no cervical LAD CV: NRRR, normal S1/S2, no murmur, distal pulses 2+ b/l Resp: CTABL, no wheezes, normal WOB Abd: +BS, soft, NTND. no guarding or organomegaly Ext: No edema, warm Neuro: Alert and oriented, strength equal b/l UE  and LE, coordination grossly normal MSK: normal muscle bulk     Assessment & Plan:    Kevin Hughes was seen today for nasal congestion, likely due to acute URI. No indications for antibiotics at this time. Discussed symptomatic care.  Diagnoses and all orders for this visit:  Acute URI  Patient Instructions Netipot with distilled water 2-3 times a day to clear out sinuses Flonase steroid nasal spray twice a day Ibuprofen  three times a day Lots of fluids  If getting worse in another 6-7 days let me know    Follow up plan: Return if symptoms worsen or fail to improve.  Rex Kras, MD [facility] 04/10/2015, 12:10 PM

## 2015-04-11 ENCOUNTER — Telehealth: Payer: Self-pay

## 2015-04-11 NOTE — Telephone Encounter (Signed)
Could be from his sinuses from the infection there. Still likely to be viral, started getting sick two days ago. Continue ibuprofen, tylenol, rest, lots of fluids. Fine to continue netipot if it isnt making anything worse.

## 2015-04-11 NOTE — Telephone Encounter (Signed)
Patients wife called to let you know that he is now coughing up some blood. Its a tiny amount and it happened when he was using his netypot. Do they need to do anything differently? His head is still hurting him. Please advise and route to pool B

## 2015-04-11 NOTE — Telephone Encounter (Signed)
Patients wife aware

## 2015-07-14 ENCOUNTER — Ambulatory Visit (INDEPENDENT_AMBULATORY_CARE_PROVIDER_SITE_OTHER): Payer: BLUE CROSS/BLUE SHIELD | Admitting: Family Medicine

## 2015-07-14 ENCOUNTER — Encounter: Payer: Self-pay | Admitting: Family Medicine

## 2015-07-14 ENCOUNTER — Telehealth: Payer: Self-pay | Admitting: Family

## 2015-07-14 VITALS — BP 131/86 | HR 68 | Temp 98.3°F | Ht 70.0 in | Wt 189.8 lb

## 2015-07-14 DIAGNOSIS — J0111 Acute recurrent frontal sinusitis: Secondary | ICD-10-CM

## 2015-07-14 MED ORDER — LEVOFLOXACIN 500 MG PO TABS: 500.0000 mg | ORAL_TABLET | Freq: Every day | ORAL | Status: DC

## 2015-07-14 MED ORDER — BETAMETHASONE SOD PHOS & ACET 6 (3-3) MG/ML IJ SUSP
6.0000 mg | Freq: Once | INTRAMUSCULAR | Status: AC
  Administered 2015-07-14: 6 mg via INTRAMUSCULAR

## 2015-07-14 MED ORDER — PSEUDOEPHEDRINE-GUAIFENESIN ER 120-1200 MG PO TB12: 1.0000 | ORAL_TABLET | Freq: Two times a day (BID) | ORAL | Status: DC

## 2015-07-14 NOTE — Telephone Encounter (Signed)
Please  write and I will sign. Thanks, WS 

## 2015-07-14 NOTE — Telephone Encounter (Signed)
Patient was seen today and has been out of work all week. He forgot to ask if he could get note covering him all week and returning on Monday. Please advise

## 2015-07-14 NOTE — Progress Notes (Signed)
Subjective:  Patient ID: Kevin Hughes, male    DOB: 03/13/1986  Age: 30 y.o. MRN: [id number]  CC: Sinusitis   HPI Kevin Hughes presents for  Symptoms include congestion, facial pain, nasal congestion, no  fever, non productive cough, post nasal drip and sinus pressure with no fever, chills, night sweats or weight loss. Onset of symptoms was a few days ago, gradually worsening since that time. Pt.is drinking moderate amounts of fluids.     History Kevin Hughes has no past medical history on file.   Kevin Hughes has no past surgical history on file.   His family history is not on file.Kevin Hughes reports that Kevin Hughes has never smoked. Kevin Hughes does not have any smokeless tobacco history on file. Kevin Hughes reports that Kevin Hughes does not drink alcohol or use illicit drugs.  Current Outpatient Prescriptions on File Prior to Visit  Medication Sig Dispense Refill  . acetaminophen (TYLENOL) 325 MG tablet Take 650 mg by mouth every 6 (six) hours as needed.     No current facility-administered medications on file prior to visit.    ROS Review of Systems  Constitutional: Negative for fever, chills, activity change and appetite change.  HENT: Positive for congestion, ear pain, postnasal drip, rhinorrhea and sinus pressure. Negative for ear discharge, hearing loss, nosebleeds, sneezing and trouble swallowing.   Respiratory: Negative for chest tightness and shortness of breath.   Cardiovascular: Negative for chest pain and palpitations.  Skin: Negative for rash.    Objective:  BP 131/86 mmHg  Pulse 68  Temp(Src) 98.3 F (36.8 C) (Oral)  Ht  (1.778 m)  Wt 189 lb 12.8 oz (86.093 kg)  BMI 27.23 kg/m2  SpO2 100%  Physical Exam  Constitutional: Kevin Hughes appears well-developed and well-nourished.  HENT:  Head: Normocephalic and atraumatic.  Right Ear: Tympanic membrane and external ear normal. No decreased hearing is noted.  Left Ear: Tympanic membrane and external ear normal. No decreased hearing is noted.  Nose: Mucosal edema  present. Right sinus exhibits no frontal sinus tenderness. Left sinus exhibits no frontal sinus tenderness.  Mouth/Throat: No oropharyngeal exudate or posterior oropharyngeal erythema.  Neck: No Brudzinski's sign noted.  Pulmonary/Chest: Breath sounds normal. No respiratory distress.  Lymphadenopathy:       Head (right side): No preauricular adenopathy present.       Head (left side): No preauricular adenopathy present.       Right cervical: No superficial cervical adenopathy present.      Left cervical: No superficial cervical adenopathy present.    Assessment & Plan:   Kevin Hughes was seen today for sinusitis.  Diagnoses and all orders for this visit:  Acute recurrent frontal sinusitis -     betamethasone acetate-betamethasone sodium phosphate (CELESTONE) injection 6 mg; Inject 1 mL (6 mg total) into the muscle once.  Other orders -     levofloxacin (LEVAQUIN) 500 MG tablet; Take 1 tablet (500 mg total) by mouth daily. For 10 days -     Pseudoephedrine-Guaifenesin [phone] MG TB12; Take 1 tablet by mouth 2 (two) times daily. For congestion   I am having Kevin Hughes start on levofloxacin and Pseudoephedrine-Guaifenesin. I am also having him maintain his acetaminophen. We will continue to administer betamethasone acetate-betamethasone sodium phosphate.  Meds ordered this encounter  Medications  . betamethasone acetate-betamethasone sodium phosphate (CELESTONE) injection 6 mg    Sig:   . levofloxacin (LEVAQUIN) 500 MG tablet    Sig: Take 1 tablet (500 mg total) by mouth daily. For 10 days  Dispense:  10 tablet    Refill:  0  . Pseudoephedrine-Guaifenesin [phone] MG TB12    Sig: Take 1 tablet by mouth 2 (two) times daily. For congestion    Dispense:  20 each    Refill:  0   CT sinus if sx not significantly better in 3 days  Follow-up: Return if symptoms worsen or fail to improve.  Mechele ClaudeWarren Tomie Spizzirri, M.D.

## 2015-07-14 NOTE — Telephone Encounter (Signed)
Patient' wife aware that we [facility] [place] and work note has been faxed.

## 2015-07-17 ENCOUNTER — Telehealth: Payer: Self-pay | Admitting: Family Medicine

## 2015-07-17 DIAGNOSIS — J0101 Acute recurrent maxillary sinusitis: Secondary | ICD-10-CM

## 2015-07-17 NOTE — Telephone Encounter (Signed)
Patient's wife aware of order for CT scan

## 2015-07-17 NOTE — Telephone Encounter (Signed)
Please order CT sinuses

## 2015-07-18 ENCOUNTER — Ambulatory Visit (HOSPITAL_COMMUNITY): Payer: BLUE CROSS/BLUE SHIELD

## 2015-12-16 ENCOUNTER — Emergency Department (HOSPITAL_COMMUNITY)
Admission: EM | Admit: 2015-12-16 | Discharge: 2015-12-16 | Disposition: A | Discharge status: Home / Self Care | Payer: BLUE CROSS/BLUE SHIELD | Attending: Emergency Medicine | Admitting: Emergency Medicine

## 2015-12-16 ENCOUNTER — Emergency Department (HOSPITAL_COMMUNITY): Payer: BLUE CROSS/BLUE SHIELD

## 2015-12-16 ENCOUNTER — Encounter (HOSPITAL_COMMUNITY): Payer: Self-pay | Admitting: Cardiology

## 2015-12-16 DIAGNOSIS — N200 Calculus of kidney: Secondary | ICD-10-CM | POA: Diagnosis not present

## 2015-12-16 DIAGNOSIS — R109 Unspecified abdominal pain: Secondary | ICD-10-CM | POA: Diagnosis present

## 2015-12-16 LAB — URINALYSIS, ROUTINE W REFLEX MICROSCOPIC
Bilirubin Urine: NEGATIVE
GLUCOSE, UA: NEGATIVE mg/dL
Leukocytes, UA: NEGATIVE
Nitrite: NEGATIVE
PH: 6 (ref 5.0–8.0)
PROTEIN: NEGATIVE mg/dL
Specific Gravity, Urine: 1.01 (ref 1.005–1.030)

## 2015-12-16 LAB — I-STAT CHEM 8, ED
BUN: 14 mg/dL (ref 6–20)
CREATININE: 0.9 mg/dL (ref 0.61–1.24)
Calcium, Ion: 1.13 mmol/L — ABNORMAL LOW (ref 1.15–1.40)
Chloride: 104 mmol/L (ref 101–111)
GLUCOSE: 95 mg/dL (ref 65–99)
HCT: 41 % (ref 39.0–52.0)
HEMOGLOBIN: 13.9 g/dL (ref 13.0–17.0)
Potassium: 3.6 mmol/L (ref 3.5–5.1)
Sodium: 144 mmol/L (ref 135–145)
TCO2: 25 mmol/L (ref 0–100)

## 2015-12-16 LAB — URINE MICROSCOPIC-ADD ON
BACTERIA UA: NONE SEEN
Squamous Epithelial / LPF: NONE SEEN
WBC, UA: NONE SEEN WBC/hpf (ref 0–5)

## 2015-12-16 IMAGING — CT CT RENAL STONE PROTOCOL
2 of 4 series · 16 of 46 positions shown, 18 images · non-contrast
Comparison: CT 12/17/2012.

CLINICAL DATA: Left flank pain for 30 minutes. History of kidney
stones.

EXAM:
CT ABDOMEN AND PELVIS WITHOUT CONTRAST
TECHNIQUE: Multidetector CT imaging of the abdomen and pelvis was performed
following the standard protocol without IV contrast.

[Series 2: axial st · axial · 0.75mm/px · z∈[-575,-115]mm · 13 of 102 slices shown, 15 images]
[im 5/102  soft-tissue]
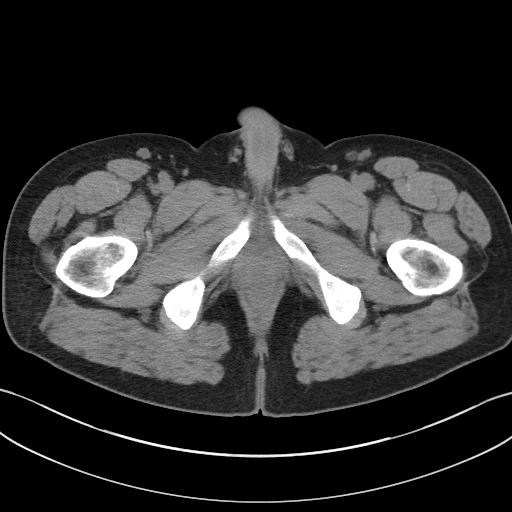
[im 5/102  bone]
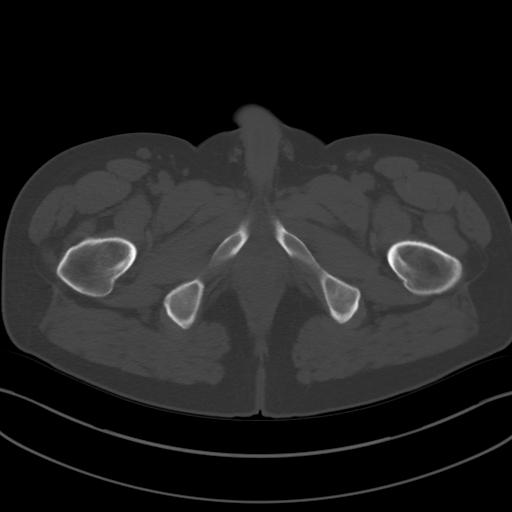
[im 13/102  soft-tissue]
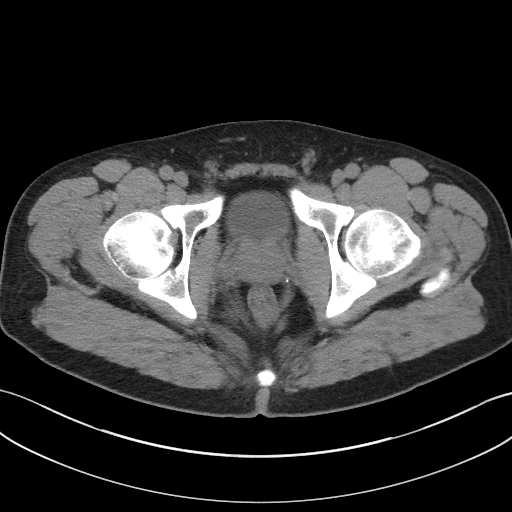
[im 21/102  soft-tissue]
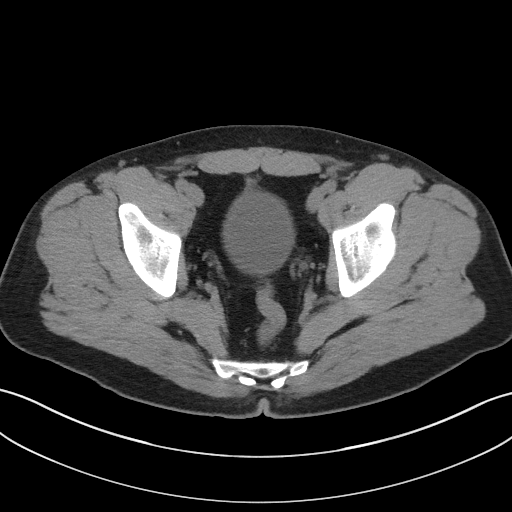
[im 29/102  soft-tissue]
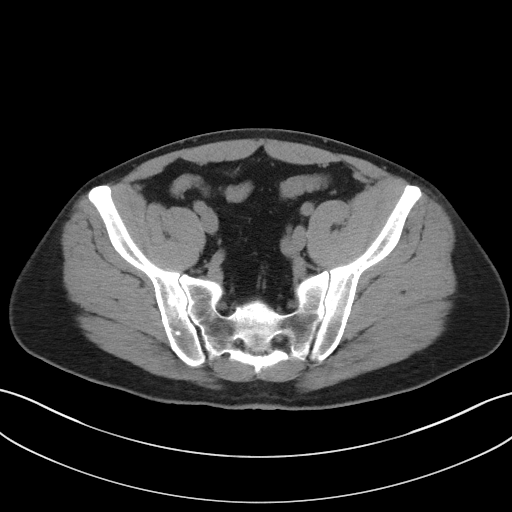
[im 37/102  soft-tissue]
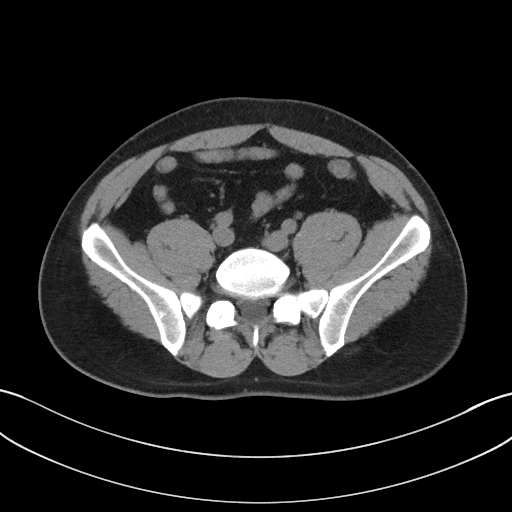
[im 45/102  soft-tissue]
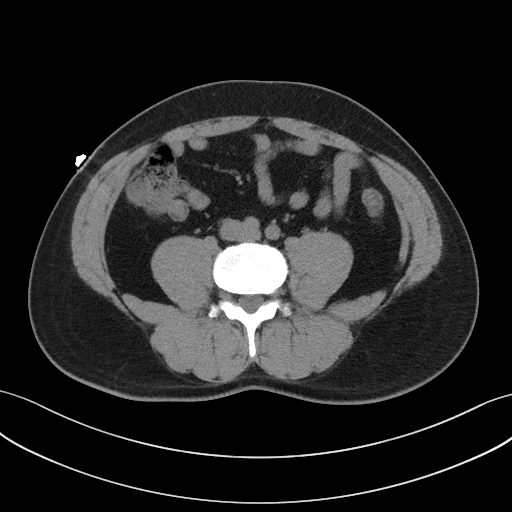
[im 53/102  soft-tissue]
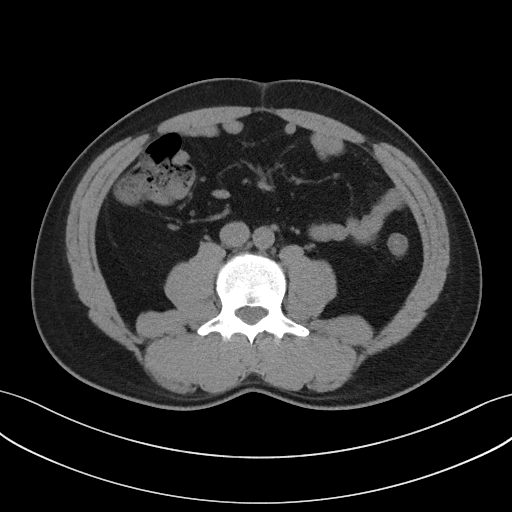
[im 57/102  soft-tissue]
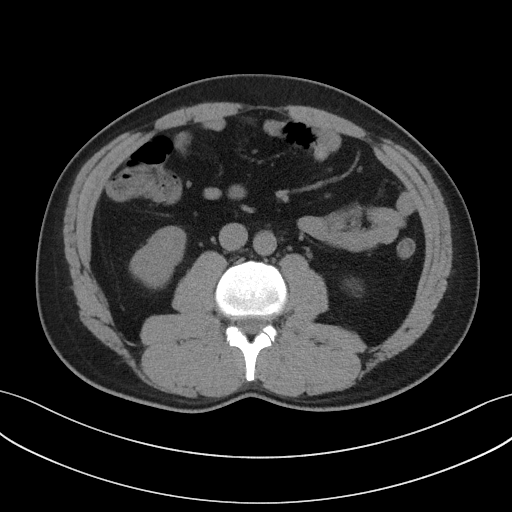
[im 65/102  soft-tissue]
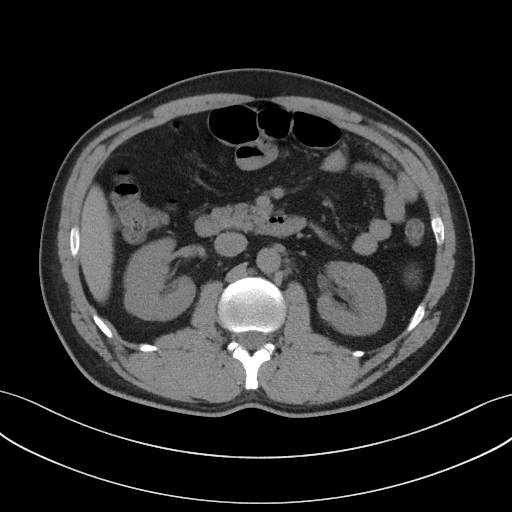
[im 65/102  bone]
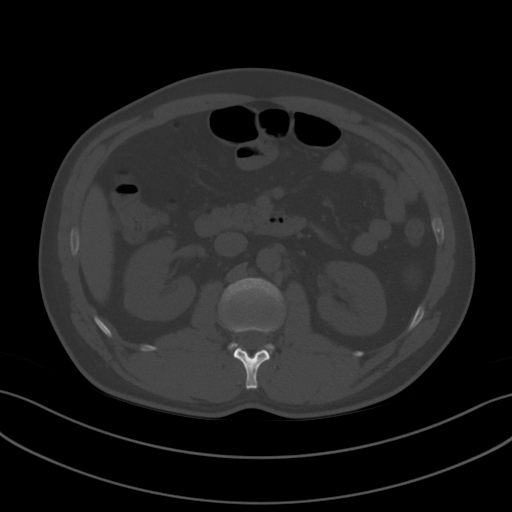
[im 73/102  soft-tissue]
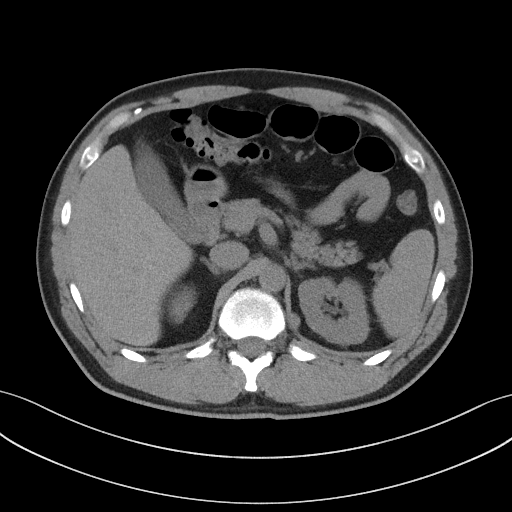
[im 81/102  soft-tissue]
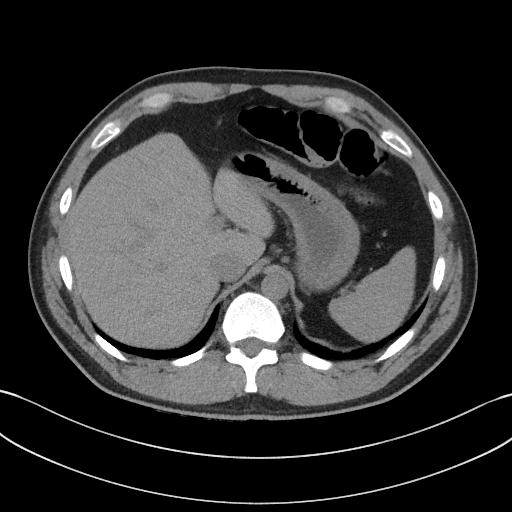
[im 89/102  soft-tissue]
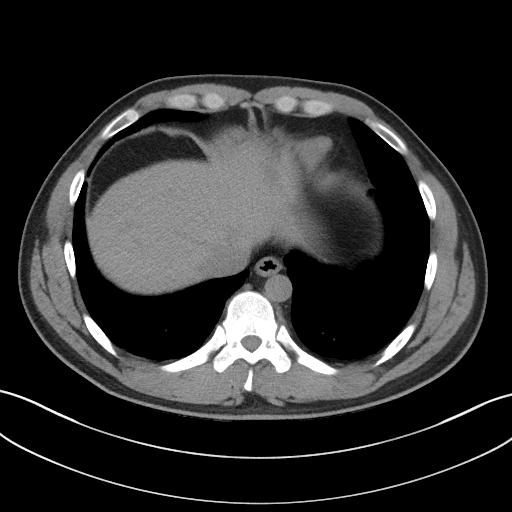
[im 97/102  soft-tissue]
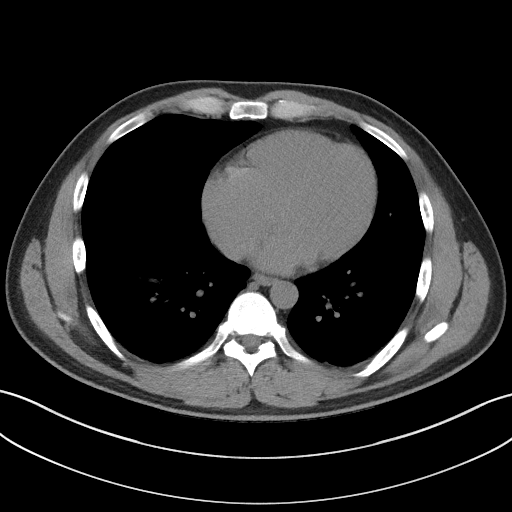

[Series 3: coronal st · coronal · 0.82mm/px · 3 of 101 slices shown]
[im 34/101  soft-tissue]
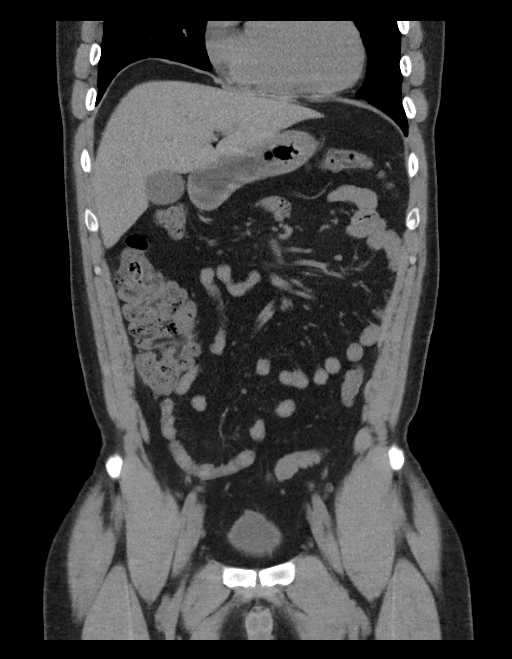
[im 45/101  soft-tissue]
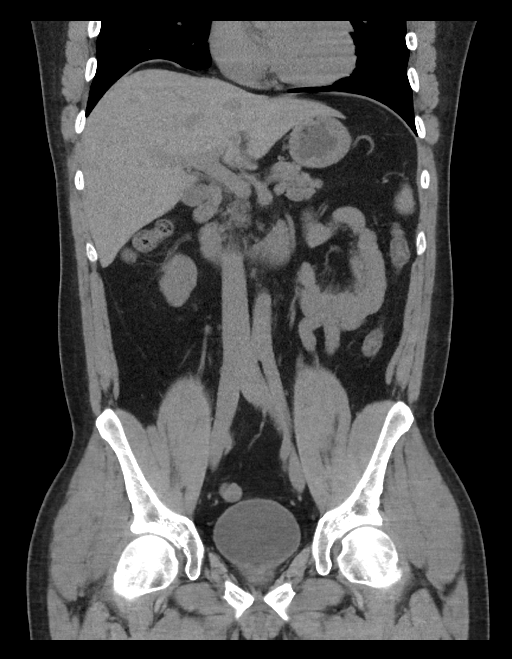
[im 56/101  soft-tissue]
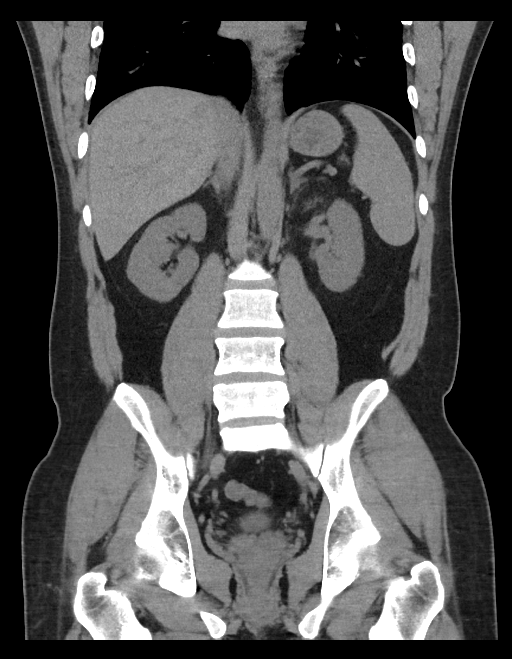

[16 of 46 positions shown; findings below may reference images not displayed]

FINDINGS: Lower chest: Stable appearance of the lung bases without suspicious
findings. No significant pleural or pericardial effusion.

Hepatobiliary: No suspicious hepatic findings are seen as imaged in
the noncontrast state. Calcification along the posterior aspect of
the right hepatic lobe on image 32 is unchanged. No evidence of
gallstones, gallbladder wall thickening or biliary dilatation.

Pancreas: Unremarkable. No pancreatic ductal dilatation or
surrounding inflammatory changes.

Spleen: Normal in size without focal abnormality.

Adrenals/Urinary Tract: Both adrenal glands appear normal. Both
kidneys appear unremarkable as imaged in the noncontrast state.
There is mild dilatation of the left ureter secondary to a 1 mm
calculus in the distal ureter (axial image 83 and coronal image 54).
No other urinary tract calculi are seen. The bladder appears
unremarkable.

Stomach/Bowel: There are few mildly dilated loops of small bowel in
the upper abdomen. These extend anterior to the stomach and the
transverse colon. There is no bowel wall thickening or surrounding
inflammatory change. The distal small bowel and colon appear
unremarkable. The appendix appears normal.

Vascular/Lymphatic: There are no enlarged abdominal or pelvic lymph
nodes. No significant vascular findings on noncontrast imaging.

Reproductive: The prostate gland and seminal vesicles appear
unremarkable.

Other: No evidence of abdominal wall mass or hernia. No ascites.

Musculoskeletal: No acute or significant osseous findings.
IMPRESSION: 1. Partially obstructing 1 mm calculus in the distal left ureter. No
other urinary tract calculi seen.
2. Mild small bowel distension in the upper abdomen, possibly focal
ileus from the ureteral calculus. Small bowel loops extend anterior
to the transverse colon, and an internal hernia is a consideration.
No evidence of bowel obstruction at this time.

## 2015-12-16 MED ORDER — TAMSULOSIN HCL 0.4 MG PO CAPS: 0.4000 mg | ORAL_CAPSULE | Freq: Every day | ORAL | 0 refills | Status: DC

## 2015-12-16 MED ORDER — OXYCODONE-ACETAMINOPHEN 5-325 MG PO TABS: 2.0000 | ORAL_TABLET | ORAL | 0 refills | Status: DC | PRN

## 2015-12-16 NOTE — Discharge Instructions (Signed)
You have been seen in the Emergency Department (ED) today for pain that we believe based on your workup, is caused by kidney stones.  As we have discussed, please drink plenty of fluids.  Please make a follow up appointment with the physician(s) listed elsewhere in this documentation. ° °You may take pain medication as needed but ONLY as prescribed.  Please also take your prescribed Flomax daily.  We also recommend that you take over-the-counter ibuprofen regularly according to label instructions over the next 5 days.  Take it with meals to minimize stomach discomfort. ° °Please see your doctor as soon as possible as stones may take 1-3 weeks to pass and you may require additional care or medications. ° °Do not drink alcohol, drive or participate in any other potentially dangerous activities while taking opiate pain medication as it may make you sleepy. Do not take this medication with any other sedating medications, either prescription or over-the-counter. If you were prescribed Percocet or Vicodin, do not take these with acetaminophen (Tylenol) as it is already contained within these medications. °  °Take Percocet as needed for severe pain.  This medication is an opiate (or narcotic) pain medication and can be habit forming.  Use it as little as possible to achieve adequate pain control.  Do not use or use it with extreme caution if you have a history of opiate abuse or dependence.  If you are on a pain contract with your primary care doctor or a pain specialist, be sure to let them know you were prescribed this medication today from the Emergency Department.  This medication is intended for your use only - do not give any to anyone else and keep it in a secure place where nobody else, especially children, have access to it.  It will also cause or worsen constipation, so you may want to consider taking an over-the-counter stool softener while you are taking this medication. ° °Return to the Emergency Department  (ED) or call your doctor if you have any worsening pain, fever, painful urination, are unable to urinate, or develop other symptoms that concern you. ° ° ° °Kidney Stones °Kidney stones (urolithiasis) are deposits that form inside your kidneys. The intense pain is caused by the stone moving through the urinary tract. When the stone moves, the ureter goes into spasm around the stone. The stone is usually passed in the urine.  °CAUSES  °A disorder that makes certain neck glands produce too much parathyroid hormone (primary hyperparathyroidism). °A buildup of uric acid crystals, similar to gout in your joints. °Narrowing (stricture) of the ureter. °A kidney obstruction present at birth (congenital obstruction). °Previous surgery on the kidney or ureters. °Numerous kidney infections. °SYMPTOMS  °Feeling sick to your stomach (nauseous). °Throwing up (vomiting). °Blood in the urine (hematuria). °Pain that usually spreads (radiates) to the groin. °Frequency or urgency of urination. °DIAGNOSIS  °Taking a history and physical exam. °Blood or urine tests. °CT scan. °Occasionally, an examination of the inside of the urinary bladder (cystoscopy) is performed. °TREATMENT  °Observation. °Increasing your fluid intake. °Extracorporeal shock wave lithotripsy--This is a noninvasive procedure that uses shock waves to break up kidney stones. °Surgery may be needed if you have severe pain or persistent obstruction. There are various surgical procedures. Most of the procedures are performed with the use of small instruments. Only small incisions are needed to accommodate these instruments, so recovery time is minimized. °The size, location, and chemical composition are all important variables that will determine the proper   choice of action for you. Talk to your health care provider to better understand your situation so that you will minimize the risk of injury to yourself and your kidney.  °HOME CARE INSTRUCTIONS  °Drink enough water  and fluids to keep your urine clear or pale yellow. This will help you to pass the stone or stone fragments. °Strain all urine through the provided strainer. Keep all particulate matter and stones for your health care provider to see. The stone causing the pain may be as small as a grain of salt. It is very important to use the strainer each and every time you pass your urine. The collection of your stone will allow your health care provider to analyze it and verify that a stone has actually passed. The stone analysis will often identify what you can do to reduce the incidence of recurrences. °Only take over-the-counter or prescription medicines for pain, discomfort, or fever as directed by your health care provider. °Keep all follow-up visits as told by your health care provider. This is important. °Get follow-up X-rays if required. The absence of pain does not always mean that the stone has passed. It may have only stopped moving. If the urine remains completely obstructed, it can cause loss of kidney function or even complete destruction of the kidney. It is your responsibility to make sure X-rays and follow-ups are completed. Ultrasounds of the kidney can show blockages and the status of the kidney. Ultrasounds are not associated with any radiation and can be performed easily in a matter of minutes. °Make changes to your daily diet as told by your health care provider. You may be told to: °Limit the amount of salt that you eat. °Eat 5 or more servings of fruits and vegetables each day. °Limit the amount of meat, poultry, fish, and eggs that you eat. °Collect a 24-hour urine sample as told by your health care provider. You may need to collect another urine sample every 6-12 months. °SEEK MEDICAL CARE IF: °You experience pain that is progressive and unresponsive to any pain medicine you have been prescribed. °SEEK IMMEDIATE MEDICAL CARE IF:  °Pain cannot be controlled with the prescribed medicine. °You have a  fever or shaking chills. °The severity or intensity of pain increases over 18 hours and is not relieved by pain medicine. °You develop a new onset of abdominal pain. °You feel faint or pass out. °You are unable to urinate. °  °This information is not intended to replace advice given to you by your health care provider. Make sure you discuss any questions you have with your health care provider. °  °Document Released: 03/04/2005 Document Revised: 11/23/2014 Document Reviewed: 08/05/2012 °Elsevier Interactive Patient Education ©2016 [employer] ° ° °

## 2015-12-16 NOTE — ED Notes (Signed)
Pt alert & oriented x4, stable gait. Patient given discharge instructions, paperwork & prescription(s). Patient informed not to drive, operate any equipment & handel any important documents 4 hours after taking pain medication. Patient  instructed to stop at the registration desk to finish any additional paperwork. Patient  verbalized understanding. Pt left department w/ no further questions. 

## 2015-12-16 NOTE — ED Provider Notes (Signed)
Emergency Department Provider Note   I have reviewed the triage vital signs and the nursing notes.   HISTORY  Chief Complaint Flank Pain   HPI Kevin Hughes is a 30 y.o. male with PMH of lumbar pain and prior kidney stone presents to the emergency department for evaluation of sudden onset left back and flank pain. Patient reports dull aching sensation throughout some of the morning. He was going about his normal activities but felt like he needed to have a bowel movement. He reports sudden worsening of pain that was extremely severe. He had one episode of vomiting. No gross hematuria. EMS was called and he was given Toradol, Zofran, morphine in route and his pain is improved on arrival. He denies any anterior abdominal discomfort. No chest pain or difficulty breathing. He has a prior kidney stone in 2014 but this seems to have passed spontaneously. He has not seen a urologist.   History reviewed. No pertinent past medical history.  Patient Active Problem List   Diagnosis Date Noted  . Lumbar pain 05/06/2014    Past Surgical History:  Procedure Laterality Date  . FINGER SURGERY    . WISDOM TOOTH EXTRACTION        Allergies Review of patient's allergies indicates not on file.  History reviewed. No pertinent family history.  Social History Social History  Substance Use Topics  . Smoking status: Never Smoker  . Smokeless tobacco: Never Used  . Alcohol use No    Review of Systems  Constitutional: No fever/chills Eyes: No visual changes. ENT: No sore throat. Cardiovascular: Denies chest pain. Respiratory: Denies shortness of breath. Gastrointestinal: No abdominal pain.  No nausea, no vomiting.  No diarrhea.  No constipation. Genitourinary: Negative for dysuria. Positive left flank pain.  Musculoskeletal: Negative for back pain. Skin: Negative for rash. Neurological: Negative for headaches, focal weakness or numbness.  10-point ROS otherwise  negative.  ____________________________________________   PHYSICAL EXAM:  VITAL SIGNS: ED Triage Vitals  Enc Vitals Group     BP 12/16/15 1551 133/68     Pulse Rate 12/16/15 1551 64     Resp 12/16/15 1551 15     Temp 12/16/15 1551 98.7 F (37.1 C)     Temp Source 12/16/15 1551 Oral     SpO2 12/16/15 1551 95 %     Weight 12/16/15 1551 192 lb (87.1 kg)     Height 12/16/15 1551 5\' 11"  (1.803 m)     Pain Score 12/16/15 1550 1   Constitutional: Alert and oriented. Well appearing and in no acute distress. Eyes: Conjunctivae are normal.  Head: Atraumatic. Nose: No congestion/rhinnorhea. Mouth/Throat: Mucous membranes are moist.  Oropharynx non-erythematous. Neck: No stridor.   Cardiovascular: Normal rate, regular rhythm. Good peripheral circulation. Grossly normal heart sounds.   Respiratory: Normal respiratory effort.  No retractions. Lungs CTAB. Gastrointestinal: Soft and nontender. No distention. Negative CVA tenderness to percussion.  Musculoskeletal: No lower extremity tenderness nor edema. No gross deformities of extremities. No tenderness to palpation of the spine.  Neurologic:  Normal speech and language. No gross focal neurologic deficits are appreciated.  Skin:  Skin is warm, dry and intact. No rash noted. Psychiatric: Mood and affect are normal. Speech and behavior are normal.  ____________________________________________   LABS (all labs ordered are listed, but only abnormal results are displayed)  Labs Reviewed  URINALYSIS, ROUTINE W REFLEX MICROSCOPIC (NOT AT [facility]) - Abnormal; Notable for the following:       Result Value   Hgb urine  dipstick MODERATE (*)    Ketones, ur TRACE (*)    All other components within normal limits  I-STAT CHEM 8, ED - Abnormal; Notable for the following:    Calcium, Ion 1.13 (*)    All other components within normal limits  URINE MICROSCOPIC-ADD ON   ____________________________________________  RADIOLOGY  Ct Renal Stone  Study  Result Date: 12/16/2015 CLINICAL DATA:  Left flank pain for 30 minutes. History of kidney stones. EXAM: CT ABDOMEN AND PELVIS WITHOUT CONTRAST TECHNIQUE: Multidetector CT imaging of the abdomen and pelvis was performed following the standard protocol without IV contrast. COMPARISON:  CT 12/17/2012. FINDINGS: Lower chest: Stable appearance of the lung bases without suspicious findings. No significant pleural or pericardial effusion. Hepatobiliary: No suspicious hepatic findings are seen as imaged in the noncontrast state. Calcification along the posterior aspect of the right hepatic lobe on image 32 is unchanged. No evidence of gallstones, gallbladder wall thickening or biliary dilatation. Pancreas: Unremarkable. No pancreatic ductal dilatation or surrounding inflammatory changes. Spleen: Normal in size without focal abnormality. Adrenals/Urinary Tract: Both adrenal glands appear normal. Both kidneys appear unremarkable as imaged in the noncontrast state. There is mild dilatation of the left ureter secondary to a 1 mm calculus in the distal ureter (axial image 83 and coronal image 54). No other urinary tract calculi are seen. The bladder appears unremarkable. Stomach/Bowel: There are few mildly dilated loops of small bowel in the upper abdomen. These extend anterior to the stomach and the transverse colon. There is no bowel wall thickening or surrounding inflammatory change. The distal small bowel and colon appear unremarkable. The appendix appears normal. Vascular/Lymphatic: There are no enlarged abdominal or pelvic lymph nodes. No significant vascular findings on noncontrast imaging. Reproductive: The prostate gland and seminal vesicles appear unremarkable. Other: No evidence of abdominal wall mass or hernia. No ascites. Musculoskeletal: No acute or significant osseous findings. IMPRESSION: 1. Partially obstructing 1 mm calculus in the distal left ureter. No other urinary tract calculi seen. 2. Mild small  bowel distension in the upper abdomen, possibly focal ileus from the ureteral calculus. Small bowel loops extend anterior to the transverse colon, and an internal hernia is a consideration. No evidence of bowel obstruction at this time. Electronically Signed   By: Carey Bullocks M.D.   On: 12/16/2015 16:53    ____________________________________________   PROCEDURES  Procedure(s) performed:   Procedures  None ____________________________________________   INITIAL IMPRESSION / ASSESSMENT AND PLAN / ED COURSE  Pertinent labs & imaging results that were available during my care of the patient were reviewed by me and considered in my medical decision making (see chart for details).  Patient presents to the emergency department for evaluation of severe, sudden onset left flank pain consistent with prior episode of kidney stone. The patient has a nontender abdomen anteriorly. No midline spine tenderness. Given the patient's age and risk factors I very low suspicion for vascular etiology of the patient's discomfort. Plan for noncontrast CT of the abdomen and pelvis to assess stone size and position. We'll obtain baseline labs and urinalysis. Patient's pain is well-controlled this time after medications given by EMS.  05:00 PM Updated patient regarding CT scan results. I believe his pain is primarily from the partially obstructing 1 mm renal stone. I did discuss the small bowel findings with the patient and advised him to return to the emergency department with signs or symptoms of small bowel obstruction. I discussed the signs and symptoms in detail with him. He is currently  pain-free. Urinalysis pending. Plan for discharge with urology follow-up, Flomax, pain control.   At this time, I do not feel there is any life-threatening condition present. I have reviewed and discussed all results (EKG, imaging, lab, urine as appropriate), exam findings with patient. I have reviewed nursing notes and  appropriate previous records.  I feel the patient is safe to be discharged home without further emergent workup. Discussed usual and customary return precautions. Patient and family (if present) verbalize understanding and are comfortable with this plan.  Patient will follow-up with their primary care provider. If they do not have a primary care provider, information for follow-up has been provided to them. All questions have been answered.  ____________________________________________  FINAL CLINICAL IMPRESSION(S) / ED DIAGNOSES  Final diagnoses:  Kidney stone     MEDICATIONS GIVEN DURING THIS VISIT:  Medications - No data to display   NEW OUTPATIENT MEDICATIONS STARTED DURING THIS VISIT:  Discharge Medication List as of 12/16/2015  6:33 PM    START taking these medications   Details  oxyCODONE-acetaminophen (PERCOCET/ROXICET) 5-325 MG tablet Take 2 tablets by mouth every 4 (four) hours as needed for severe pain., Starting Sat 12/16/2015, Print    tamsulosin (FLOMAX) 0.4 MG CAPS capsule Take 1 capsule (0.4 mg total) by mouth daily., Starting Sat 12/16/2015, Print          Note:  This document was prepared using Dragon voice recognition software and may include unintentional dictation errors.  Alona BeneJoshua Long, MD Emergency Medicine   Maia PlanJoshua G Long, MD 12/17/15 1120

## 2015-12-16 NOTE — ED Triage Notes (Signed)
Left flank pain times 30 minutes.  Vomited times 1.  Transported by EMS. EMS gave toradol 30 mg,  Morphine 16 mg ,  zofran 4 mg and 600 cc NS bolus.  Pt rates pain 1-2/10 now.

## 2016-03-30 ENCOUNTER — Telehealth: Payer: 59 | Admitting: Physician Assistant

## 2016-03-30 DIAGNOSIS — B9689 Other specified bacterial agents as the cause of diseases classified elsewhere: Secondary | ICD-10-CM

## 2016-03-30 DIAGNOSIS — H60502 Unspecified acute noninfective otitis externa, left ear: Secondary | ICD-10-CM | POA: Diagnosis not present

## 2016-03-30 DIAGNOSIS — J019 Acute sinusitis, unspecified: Secondary | ICD-10-CM

## 2016-03-30 MED ORDER — AMOXICILLIN-POT CLAVULANATE 875-125 MG PO TABS: 1.0000 | ORAL_TABLET | Freq: Two times a day (BID) | ORAL | 0 refills | Status: DC

## 2016-03-30 NOTE — Progress Notes (Signed)

## 2016-04-01 ENCOUNTER — Other Ambulatory Visit: Payer: Self-pay | Admitting: *Deleted

## 2016-04-01 MED ORDER — OSELTAMIVIR PHOSPHATE 75 MG PO CAPS: 75.0000 mg | ORAL_CAPSULE | Freq: Two times a day (BID) | ORAL | 0 refills | Status: DC

## 2016-04-03 ENCOUNTER — Telehealth: Payer: BLUE CROSS/BLUE SHIELD | Admitting: Nurse Practitioner

## 2016-04-03 DIAGNOSIS — J0101 Acute recurrent maxillary sinusitis: Secondary | ICD-10-CM

## 2016-04-03 NOTE — Progress Notes (Signed)
If augmetin is not working then it is viral and will hav eto run its course or will need : Based on what you shared with me it looks like you have a serious condition that should be evaluated in a face to face office visit.  NOTE: Even if you have entered your credit card information for this eVisit, you will not be charged.   If you are having a true medical emergency please call 911.  If you need an urgent face to face visit, [facility] has four urgent care centers for your convenience.  If you need care fast and have a high deductible or no insurance consider:   [email or web address]  [phone]  [address], [address] [place], [place] [address] 8 am to 8 pm Monday-Friday 10 am to 4 pm Saturday-Sunday   The following sites will take your  insurance:    . [facility]  [phone] Get Driving Directions Find a Provider at this Location  [address] [place], [place] [address] . 10 am to 8 pm Monday-Friday . 12 pm to 8 pm Saturday-Sunday   . [facility]  [phone] Get Driving Directions Find a Provider at this Location  [address] [place], [place] [address] . 8 am to 8 pm Monday-Friday . 9 am to 6 pm Saturday . 11 am to 6 pm Sunday   . [facility]  [phone] Get Driving Directions  [address] [place], [place] [address] . 8 am to 8 pm Monday-Friday . 8 am to 4 pm Saturday-Sunday   . [facility] (walk-ins welcome, or call for a scheduled time)  [phone]  Get Driving Directions Find a Provider at this Location  [address] [place], [place] [address] . 8 am to 8:30 pm Monday-Thursday . 8 am to 6 pm Friday . 8 am to 4 pm Saturday-Sunday   Your e-visit answers were reviewed by a board certified advanced clinical practitioner to complete your personal care plan.  Thank you for using e-Visits.

## 2016-04-04 DIAGNOSIS — J019 Acute sinusitis, unspecified: Secondary | ICD-10-CM | POA: Diagnosis not present

## 2016-04-04 DIAGNOSIS — B379 Candidiasis, unspecified: Secondary | ICD-10-CM | POA: Diagnosis not present

## 2016-04-05 DIAGNOSIS — J329 Chronic sinusitis, unspecified: Secondary | ICD-10-CM | POA: Diagnosis not present

## 2016-04-05 DIAGNOSIS — J342 Deviated nasal septum: Secondary | ICD-10-CM | POA: Diagnosis not present

## 2016-04-09 ENCOUNTER — Encounter: Payer: Self-pay | Admitting: Family Medicine

## 2016-04-09 ENCOUNTER — Other Ambulatory Visit: Payer: Self-pay | Admitting: Otolaryngology

## 2016-04-09 ENCOUNTER — Ambulatory Visit (INDEPENDENT_AMBULATORY_CARE_PROVIDER_SITE_OTHER): Payer: 59 | Admitting: Family Medicine

## 2016-04-09 VITALS — BP 123/85 | HR 86 | Temp 98.2°F | Ht 71.0 in | Wt 192.0 lb

## 2016-04-09 DIAGNOSIS — G4489 Other headache syndrome: Secondary | ICD-10-CM | POA: Diagnosis not present

## 2016-04-09 DIAGNOSIS — J328 Other chronic sinusitis: Secondary | ICD-10-CM

## 2016-04-09 MED ORDER — BETAMETHASONE SOD PHOS & ACET 6 (3-3) MG/ML IJ SUSP
6.0000 mg | Freq: Once | INTRAMUSCULAR | Status: AC
  Administered 2016-04-09: 6 mg via INTRAMUSCULAR

## 2016-04-09 MED ORDER — CETIRIZINE HCL 10 MG PO TABS
10.0000 mg | ORAL_TABLET | Freq: Every day | ORAL | 11 refills | Status: DC
Start: 2016-04-09 — End: 2019-04-08

## 2016-04-09 MED ORDER — PSEUDOEPHEDRINE-GUAIFENESIN ER 120-1200 MG PO TB12: 1.0000 | ORAL_TABLET | Freq: Two times a day (BID) | ORAL | 0 refills | Status: DC

## 2016-04-09 MED ORDER — KETOROLAC TROMETHAMINE 60 MG/2ML IM SOLN
60.0000 mg | Freq: Once | INTRAMUSCULAR | Status: AC
  Administered 2016-04-09: 60 mg via INTRAMUSCULAR

## 2016-04-09 NOTE — Progress Notes (Signed)
Subjective:  Patient ID: Kevin Hughes, male    DOB: 1985/04/21  Age: 31 y.o. MRN: [id number]  CC: Headache   HPI Kevin Hughes presents for Headache resulting from sinusitis. Symptoms had included  congestion, facial pain, nasal congestion, no  fever, non productive cough, post nasal drip and sinus pressure with no fever, chills, night sweats or weight loss. Currently those symptoms have started improving on their own. However he has developed a severe headache he describes as a heavy sensation, like something is tightening in his head. 6/10. Fairly constant since onset. All he can do for relief of his leg down in bed. Yesterday he had to leave work because of dizziness. He's had a fair amount of nausea as well. For the initial sinus infection he took a course of Augmentin. He went to his ENT and that was changed to Ceftin a couple of days ago. Onset of headache and related symptoms was a 9 days ago,  worsening since that time.    History Kevin Hughes has no past medical history on file.   He has a past surgical history that includes Wisdom tooth extraction and Finger surgery.   His family history is not on file.He reports that he has never smoked. He has never used smokeless tobacco. He reports that he does not drink alcohol or use drugs.    ROS Review of Systems  Constitutional: Negative for chills, diaphoresis, fever and unexpected weight change.  HENT: Positive for postnasal drip and sinus pressure. Negative for congestion, hearing loss, rhinorrhea and sore throat.   Eyes: Negative for visual disturbance.  Respiratory: Negative for cough and shortness of breath.   Cardiovascular: Negative for chest pain.  Gastrointestinal: Negative for abdominal pain, constipation and diarrhea.  Genitourinary: Negative for dysuria and flank pain.  Musculoskeletal: Negative for arthralgias, joint swelling and myalgias.  Skin: Negative for rash.  Neurological: Positive for dizziness and headaches. Negative  for weakness.  Psychiatric/Behavioral: Negative for dysphoric mood and sleep disturbance.    Objective:  BP 123/85   Pulse 86   Temp 98.2 F (36.8 C) (Oral)   Ht 5\' 11"  (1.803 m)   Wt 192 lb (87.1 kg)   BMI 26.78 kg/m   BP Readings from Last 3 Encounters:  04/09/16 123/85  12/16/15 111/71  07/14/15 131/86    Wt Readings from Last 3 Encounters:  04/09/16 192 lb (87.1 kg)  12/16/15 192 lb (87.1 kg)  07/14/15 189 lb 12.8 oz (86.1 kg)     Physical Exam  Constitutional: He is oriented to person, place, and time. He appears well-developed and well-nourished. No distress.  HENT:  Head: Normocephalic and atraumatic.  Right Ear: External ear normal.  Left Ear: External ear normal.  Nose: Nose normal.  Mouth/Throat: Oropharynx is clear and moist.  Eyes: Conjunctivae and EOM are normal. Pupils are equal, round, and reactive to light.  Neck: Normal range of motion. Neck supple. No thyromegaly present.  Cardiovascular: Normal rate, regular rhythm and normal heart sounds.   No murmur heard. Pulmonary/Chest: Effort normal and breath sounds normal. No respiratory distress. He has no wheezes. He has no rales.  Abdominal: Soft. Bowel sounds are normal. He exhibits no distension. There is no tenderness.  Musculoskeletal: Normal range of motion.  Lymphadenopathy:    He has no cervical adenopathy.  Neurological: He is alert and oriented to person, place, and time. He has normal reflexes. No cranial nerve deficit. He exhibits normal muscle tone. Coordination normal.  Skin: Skin is warm and  dry.  Psychiatric: He has a normal mood and affect. His behavior is normal. Judgment and thought content normal.    Ct Renal Stone Study  Result Date: 12/16/2015 CLINICAL DATA:  Left flank pain for 30 minutes. History of kidney stones. EXAM: CT ABDOMEN AND PELVIS WITHOUT CONTRAST TECHNIQUE: Multidetector CT imaging of the abdomen and pelvis was performed following the standard protocol without IV  contrast. COMPARISON:  CT 12/17/2012. FINDINGS: Lower chest: Stable appearance of the lung bases without suspicious findings. No significant pleural or pericardial effusion. Hepatobiliary: No suspicious hepatic findings are seen as imaged in the noncontrast state. Calcification along the posterior aspect of the right hepatic lobe on image 32 is unchanged. No evidence of gallstones, gallbladder wall thickening or biliary dilatation. Pancreas: Unremarkable. No pancreatic ductal dilatation or surrounding inflammatory changes. Spleen: Normal in size without focal abnormality. Adrenals/Urinary Tract: Both adrenal glands appear normal. Both kidneys appear unremarkable as imaged in the noncontrast state. There is mild dilatation of the left ureter secondary to a 1 mm calculus in the distal ureter (axial image 83 and coronal image 54). No other urinary tract calculi are seen. The bladder appears unremarkable. Stomach/Bowel: There are few mildly dilated loops of small bowel in the upper abdomen. These extend anterior to the stomach and the transverse colon. There is no bowel wall thickening or surrounding inflammatory change. The distal small bowel and colon appear unremarkable. The appendix appears normal. Vascular/Lymphatic: There are no enlarged abdominal or pelvic lymph nodes. No significant vascular findings on noncontrast imaging. Reproductive: The prostate gland and seminal vesicles appear unremarkable. Other: No evidence of abdominal wall mass or hernia. No ascites. Musculoskeletal: No acute or significant osseous findings. IMPRESSION: 1. Partially obstructing 1 mm calculus in the distal left ureter. No other urinary tract calculi seen. 2. Mild small bowel distension in the upper abdomen, possibly focal ileus from the ureteral calculus. Small bowel loops extend anterior to the transverse colon, and an internal hernia is a consideration. No evidence of bowel obstruction at this time. Electronically Signed   By:  Carey Bullocks M.D.   On: 12/16/2015 16:53    Assessment & Plan:   Kevin Hughes was seen today for headache.  Diagnoses and all orders for this visit:  Other headache syndrome -     betamethasone acetate-betamethasone sodium phosphate (CELESTONE) injection 6 mg; Inject 1 mL (6 mg total) into the muscle once. -     ketorolac (TORADOL) injection 60 mg; Inject 2 mLs (60 mg total) into the muscle once.  Other orders -     Pseudoephedrine-Guaifenesin [id number] MG TB12; Take 1 tablet by mouth 2 (two) times daily. For congestion -     cetirizine (ZYRTEC) 10 MG tablet; Take 1 tablet (10 mg total) by mouth daily. For allergy and headache prevention     I have discontinued Mr. Broker oxyCODONE-acetaminophen, tamsulosin, amoxicillin-clavulanate, and oseltamivir. I am also having him start on Pseudoephedrine-Guaifenesin and cetirizine. Additionally, I am having him maintain his fluticasone and cefUROXime. We will continue to administer betamethasone acetate-betamethasone sodium phosphate and ketorolac.  Allergies as of 04/09/2016   No Known Allergies     Medication List       Accurate as of 04/09/16  8:47 AM. Always use your most recent med list.          cefUROXime 500 MG tablet Commonly known as:  CEFTIN   cetirizine 10 MG tablet Commonly known as:  ZYRTEC Take 1 tablet (10 mg total) by mouth daily. For allergy and  headache prevention   fluticasone 50 MCG/ACT nasal spray Commonly known as:  FLONASE Place 1 spray into both nostrils daily.   Pseudoephedrine-Guaifenesin [phone] MG Tb12 Take 1 tablet by mouth 2 (two) times daily. For congestion     Continue the antibiotic. Follow-up: No Follow-up on file.  Mechele ClaudeWarren Chantilly Linskey, M.D.

## 2016-04-10 ENCOUNTER — Ambulatory Visit
Admission: RE | Admit: 2016-04-10 | Discharge: 2016-04-10 | Disposition: A | Discharge status: Home / Self Care | Payer: BLUE CROSS/BLUE SHIELD | Source: Ambulatory Visit | Attending: Otolaryngology | Admitting: Otolaryngology

## 2016-04-10 DIAGNOSIS — J328 Other chronic sinusitis: Secondary | ICD-10-CM

## 2016-04-10 DIAGNOSIS — Z01818 Encounter for other preprocedural examination: Secondary | ICD-10-CM | POA: Diagnosis not present

## 2016-04-10 IMAGING — CT CT MAXILLOFACIAL W/O CM
3 series · 17 of 40 positions shown, 19 images · non-contrast
Comparison: 05/25/2014

CLINICAL DATA: Sinus infection over the last 10 days. Deviated
septum. Preoperative evaluation. Fusion protocol.

EXAM:
CT MAXILLOFACIAL WITHOUT CONTRAST
TECHNIQUE: Multidetector CT images of the paranasal sinuses were obtained using
the standard protocol without intravenous contrast.

[Series 3: axial soft 1.25 · axial · 0.49mm/px · z∈[-47,+77]mm · 6 of 173 slices shown]
[im 19/173  brain]
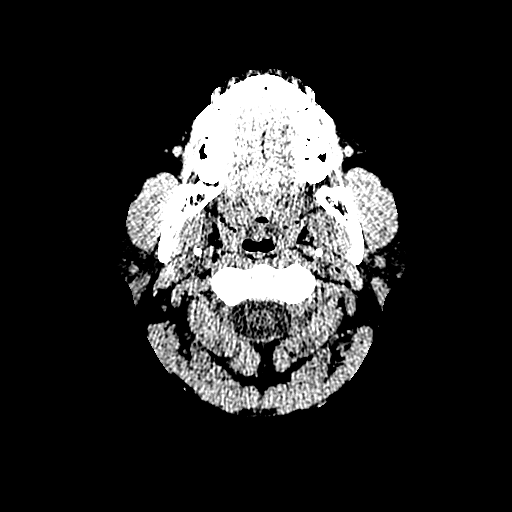
[im 37/173  brain]
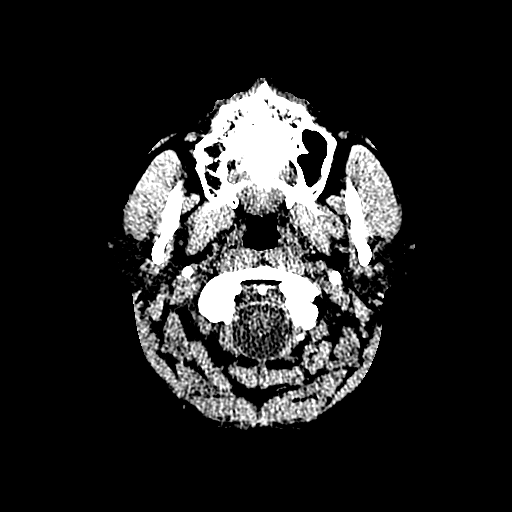
[im 55/173  brain]
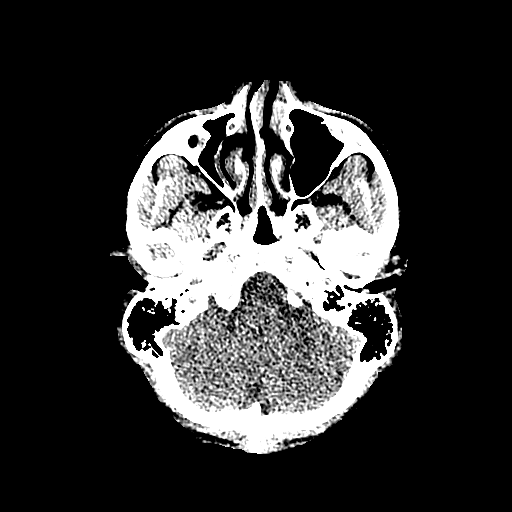
[im 73/173  brain]
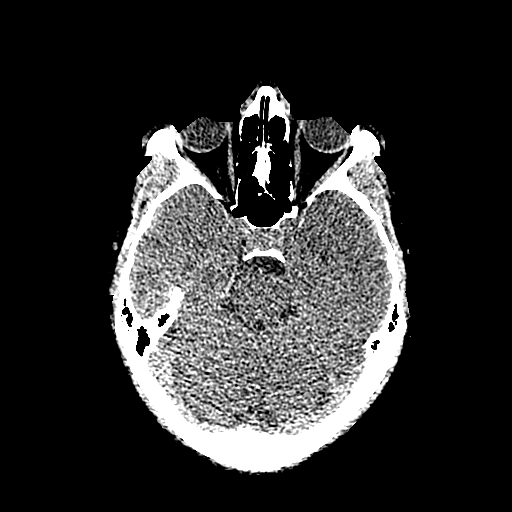
[im 100/173  brain]
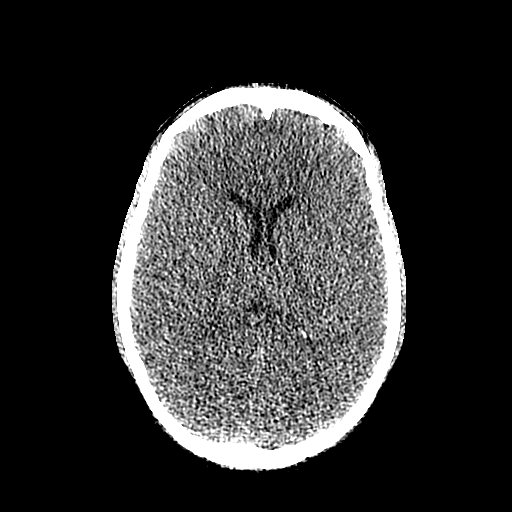
[im 118/173  brain]
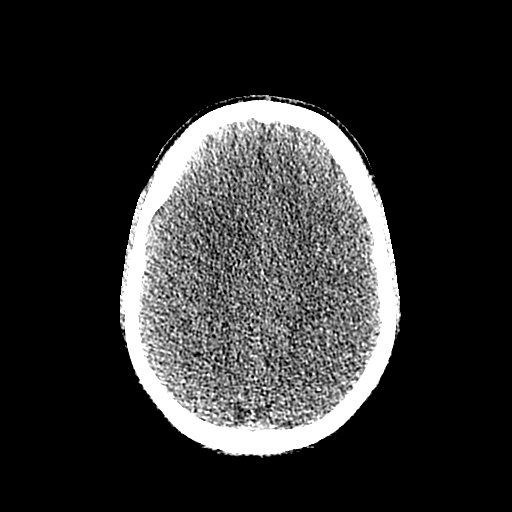

[Series 601: coronal facial · coronal · 0.49mm/px · 3 of 114 slices shown]
[im 38/114  bone]
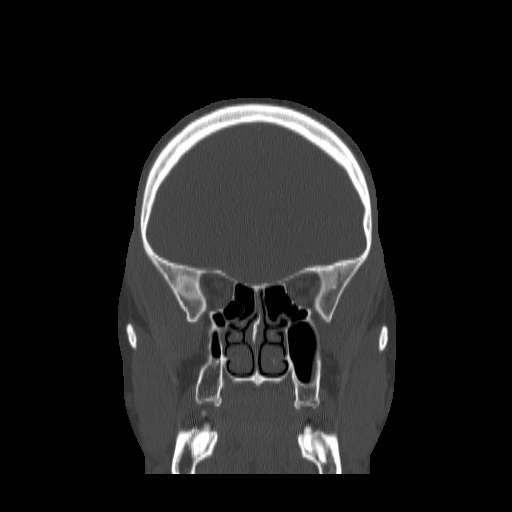
[im 51/114  bone]
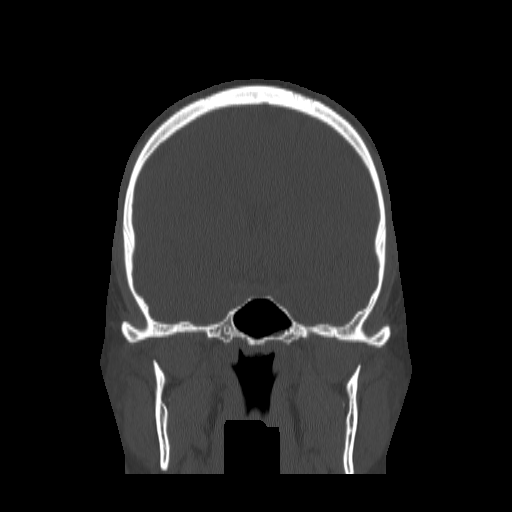
[im 63/114  bone]
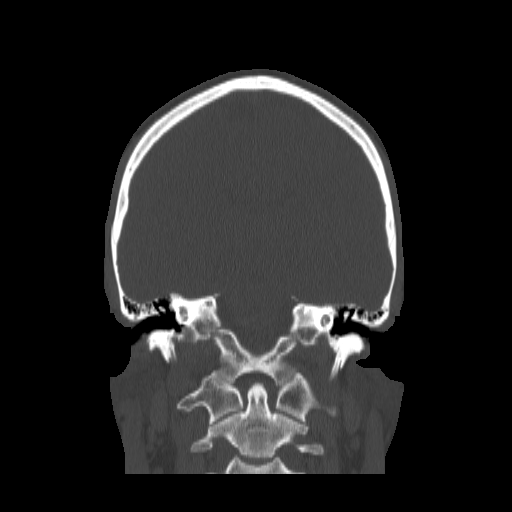

[Series 602: sagittal facial · sagittal · 0.49mm/px · 8 of 88 slices shown, 10 images]
[im 10/88  brain]
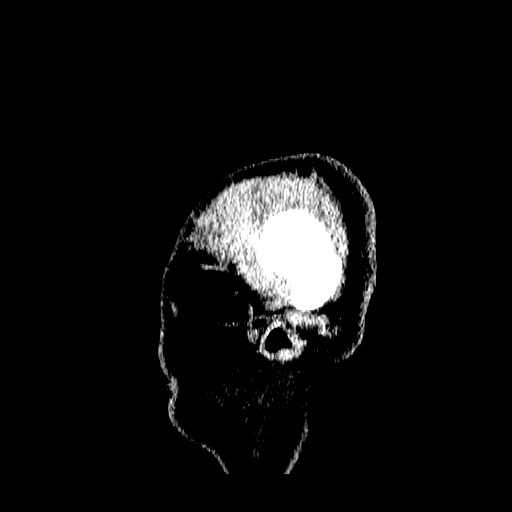
[im 10/88  bone]
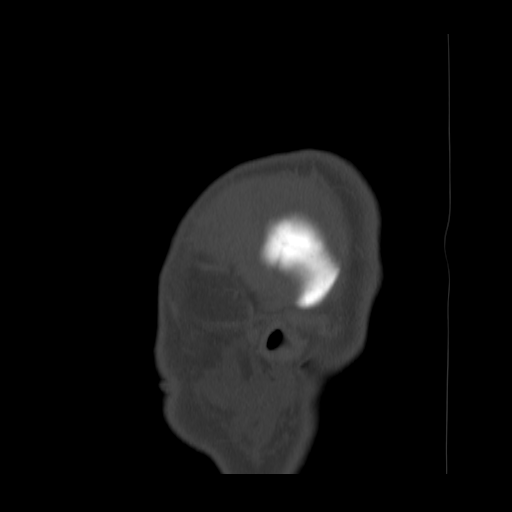
[im 20/88  bone]
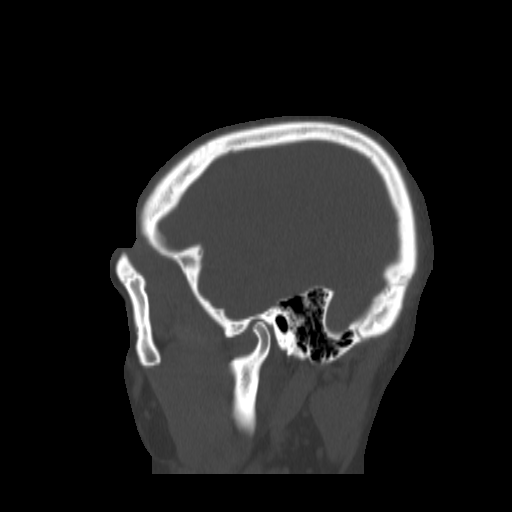
[im 30/88  bone]
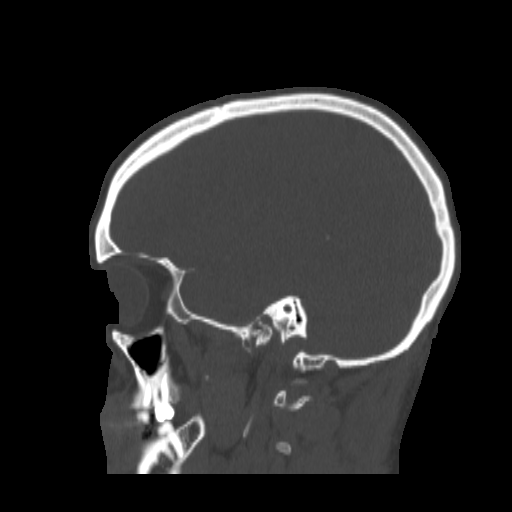
[im 39/88  bone]
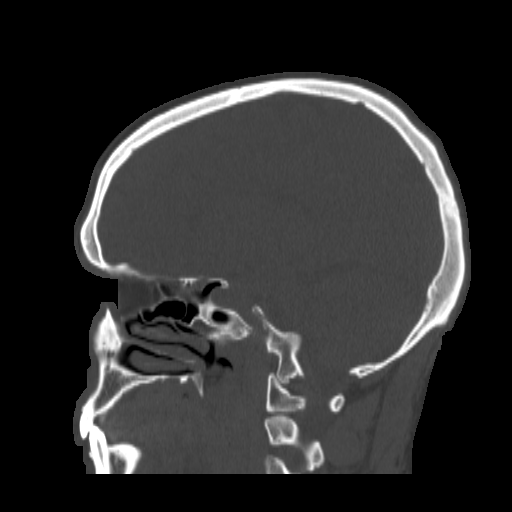
[im 49/88  brain]
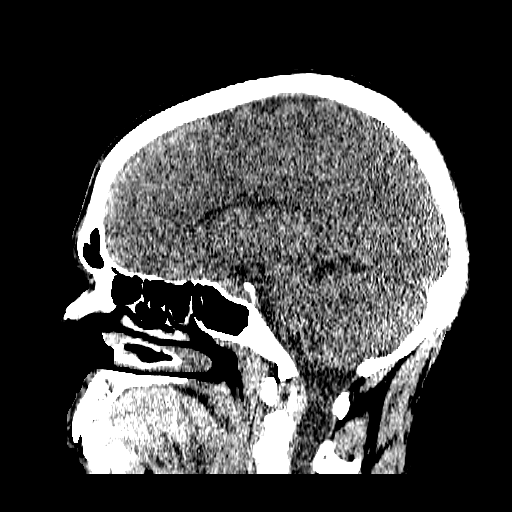
[im 49/88  bone]
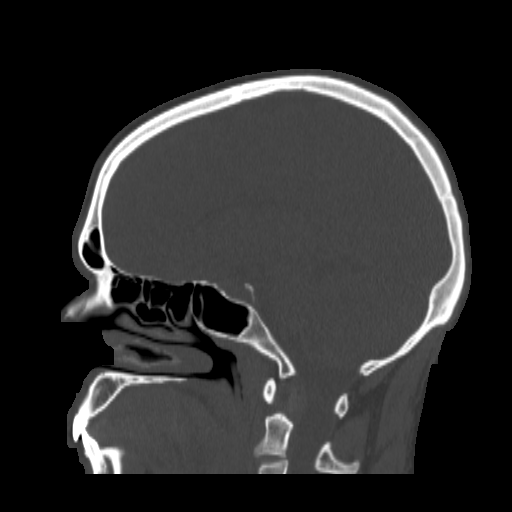
[im 59/88  bone]
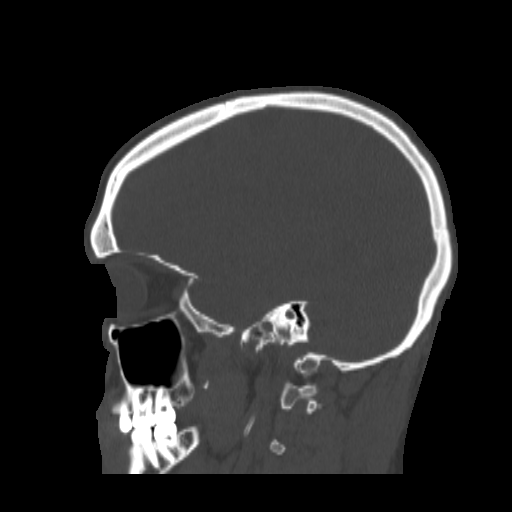
[im 68/88  bone]
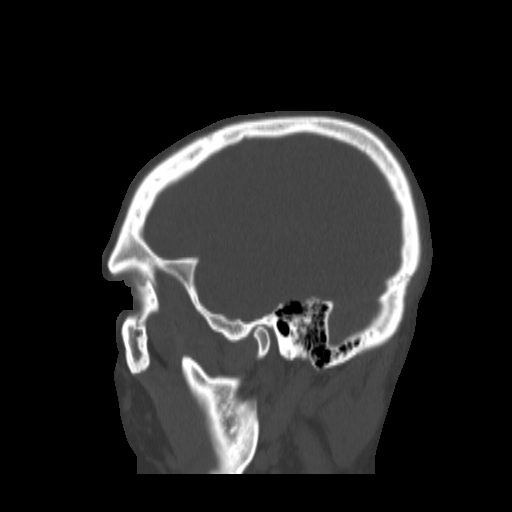
[im 78/88  bone]
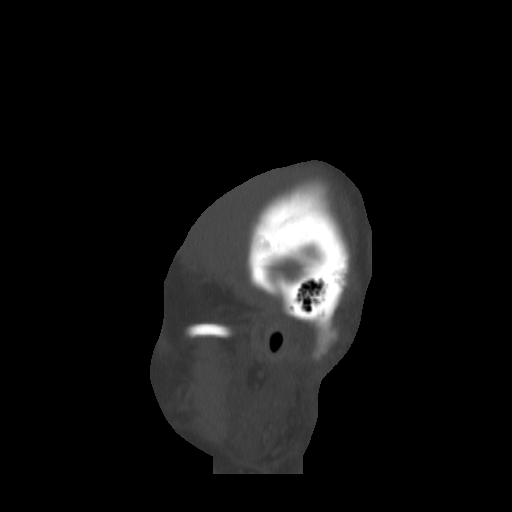

[17 of 40 positions shown; findings below may reference images not displayed]

FINDINGS: Frontal sinuses: Right division hypoplastic. Left and central
divisions clear without fluid or mucosal thickening.

Ethmoid sinuses: Clear and normal except for a few small opacified
cells, likely subclinical.

Sphenoid sinus:  Clear and normal.

Maxillary sinuses: Left maxillary sinus is normally developed.
Minimal mucosal thickening along the lateral wall, probably not
significant. Small retention cysts at the floor, probably not
significant. Left ostiomeatal complexes widely patent. Right
maxillary sinus is congenitally small. Mild mucosal thickening along
the roof and medial wall. Infundibulum is markedly narrowed.

Nasal septum bows 5 mm towards the right anteriorly. Posterior right
projecting septal spur. No concha bullosa up. Middle turbinate on
the right is asymmetrically enlarged because of the developmental
hypoplasia of the right maxillary sinus.

Lamina papyracea intact bilaterally. Olfactory grooves are slightly
asymmetric, slightly deeper on the left than the right. Anterior
ethmoidal artery notch is are symmetric in within normal limits.
Clival pneumatization is sellar.
IMPRESSION: Congenitally aplastic right division of the frontal sinus.
Congenitally hypoplastic right maxillary sinus. Mucosal thickening
along the roof and medial wall of the hypoplastic right maxillary
sinus. Mucosal thickening in the right infundibulum, which is
narrow. No evidence of layering fluid.

Nasal septum bows 5 mm towards the right anteriorly. Middle
turbinate on the right is congenitally enlarged, compensatory for
the hypoplastic right maxillary sinus.

## 2016-12-08 ENCOUNTER — Telehealth: Payer: 59 | Admitting: Family

## 2016-12-08 DIAGNOSIS — H60502 Unspecified acute noninfective otitis externa, left ear: Secondary | ICD-10-CM | POA: Diagnosis not present

## 2016-12-08 MED ORDER — NEOMYCIN-POLYMYXIN-HC 3.5-10000-1 OT SOLN: 4.0000 [drp] | Freq: Four times a day (QID) | OTIC | 0 refills | Status: DC

## 2016-12-08 NOTE — Progress Notes (Signed)
E Visit for Swimmer's Ear  We are sorry that you are not feeling well. Here is how we plan to help!  Based on what you have shared with me it looks like you have swimmers ear. Swimmer's ear is a redness or swelling, irritation, or infection of your outer ear canal.  These symptoms usually occur within a few days of swimming.  Your ear canal is a tube that goes from the opening of the ear to the eardrum.  When water stays in your ear canal, germs can grow.  This is a painful condition that often happens to children and swimmers of all ages.  It is not contagious and oral antibiotics are not required to treat uncomplicated swimmer's ear.  The usual symptoms include: Itching inside the ear, Redness or a sense of swelling in the ear, Pain when the ear is tugged on when pressure is placed on the ear, Pus draining from the infected ear. and I have prescribed: Neomycin 0.35%, polymyxin B 10,000 units/mL, and hydrocortisone 0,5% otic solution 4 drops in affected ears four times a day until completed    In certain cases swimmer's ear may progress to a more serious bacterial infection of the middle or inner ear.  If you have a fever 102 and up and significantly worsening symptoms, this could indicate a more serious infection moving to the middle/inner and needs face to face evaluation in an office by a provider.  Your symptoms should improve over the next 3 days and should resolve in about 7 days.  HOME CARE:   Wash your hands frequently.  Do not place the tip of the bottle on your ear or touch it with your fingers.  You can take Acetominophen 650 mg every 4-6 hours as needed for pain.  If pain is severe or moderate, you can apply a heating pad (set on low) or hot water bottle (wrapped in a towel) to outer ear for 20 minutes.  This will also increase drainage.  Avoid ear plugs  Do not use Q-tips  After showers, help the water run out by tilting your head to one side.  GET HELP RIGHT AWAY  IF:   Fever is over 102.2 degrees.  You develop progressive ear pain or hearing loss.  Ear symptoms persist longer than 3 days after treatment.  MAKE SURE YOU:   Understand these instructions.  Will watch your condition.  Will get help right away if you are not doing well or get worse.  TO PREVENT SWIMMER'S EAR:  Use a bathing cap or custom fitted swim molds to keep your ears dry.  Towel off after swimming to dry your ears.  Tilt your head or pull your earlobes to allow the water to escape your ear canal.  If there is still water in your ears, consider using a hairdryer on the lowest setting.  Thank you for choosing an e-visit. Your e-visit answers were reviewed by a board certified advanced clinical practitioner to complete your personal care plan. Depending upon the condition, your plan could have included both over the counter or prescription medications. Please review your pharmacy choice. Be sure that the pharmacy you have chosen is open so that you can pick up your prescription now.  If there is a problem you may message your provider in MyChart to have the prescription routed to another pharmacy. Your safety is important to us. If you have drug allergies check your prescription carefully.  For the next 24 hours, you can use   MyChart to ask questions about today's visit, request a non-urgent call back, or ask for a work or school excuse from your e-visit provider. You will get an email in the next two days asking about your experience. I hope that your e-visit has been valuable and will speed your recovery.       

## 2017-01-15 ENCOUNTER — Telehealth: Payer: 59 | Admitting: Family

## 2017-01-15 DIAGNOSIS — J329 Chronic sinusitis, unspecified: Secondary | ICD-10-CM | POA: Diagnosis not present

## 2017-01-15 DIAGNOSIS — B9789 Other viral agents as the cause of diseases classified elsewhere: Secondary | ICD-10-CM

## 2017-01-15 MED ORDER — AMOXICILLIN-POT CLAVULANATE 875-125 MG PO TABS: 1.0000 | ORAL_TABLET | Freq: Two times a day (BID) | ORAL | 0 refills | Status: DC

## 2017-01-15 MED ORDER — FLUTICASONE PROPIONATE 50 MCG/ACT NA SUSP: 2.0000 | Freq: Every day | NASAL | 6 refills | Status: DC

## 2017-01-15 NOTE — Addendum Note (Signed)
Addended by: Jannifer RodneyHAWKS, Zymier Rodgers A on: 01/15/2017 08:39 PM   Modules accepted: Orders

## 2017-01-15 NOTE — Progress Notes (Signed)

## 2017-01-20 ENCOUNTER — Encounter: Payer: Self-pay | Admitting: Family Medicine

## 2017-01-20 ENCOUNTER — Ambulatory Visit (INDEPENDENT_AMBULATORY_CARE_PROVIDER_SITE_OTHER): Payer: 59 | Admitting: Family Medicine

## 2017-01-20 VITALS — BP 128/88 | HR 80 | Temp 97.6°F | Ht 71.0 in | Wt 197.0 lb

## 2017-01-20 DIAGNOSIS — J0141 Acute recurrent pansinusitis: Secondary | ICD-10-CM | POA: Diagnosis not present

## 2017-01-20 MED ORDER — BETAMETHASONE SOD PHOS & ACET 6 (3-3) MG/ML IJ SUSP
6.0000 mg | Freq: Once | INTRAMUSCULAR | Status: AC
  Administered 2017-01-20: 6 mg via INTRAMUSCULAR

## 2017-01-20 MED ORDER — LEVOFLOXACIN 500 MG PO TABS: 500.0000 mg | ORAL_TABLET | Freq: Every day | ORAL | 0 refills | Status: DC

## 2017-01-20 NOTE — Progress Notes (Signed)
Chief Complaint  Patient presents with  . Sinusitis    pt here today c/o sinus pressure, congestion and thinks it might be a sinus infection. He did an e-visit on Saturday and was given Augmentin and Flonase but feels like it isn't getting any better.    HPI  Patient presents today for Patient presents with upper respiratory congestion. Rhinorrhea that is frequently purulent. There is moderate sore throat. Patient reports coughing occasionally.  No sputum noted. There is no fever, chills or sweats. The patient denies being short of breath. Onset was 2 weeks ago. Gradually worsening. Tried OTCs without improvement. Given augmentin 5 days ago via e-visit. No improovement. Pain & presure at cheeks & forehead. Seems to happen quite frequently. Considering ENT surgery.  PMH: Smoking status noted ROS: Per HPI  Objective: BP 128/88   Pulse 80   Temp 97.6 F (36.4 C) (Oral)   Ht 5\' 11"  (1.803 m)   Wt 197 lb (89.4 kg)   BMI 27.48 kg/m  Gen: NAD, alert, cooperative with exam HEENT: NCAT, Nasal passages swollen, red TMS clear. Max & frontal sinuses tender bilaterally. CV: RRR, good S1/S2, no murmur Resp: CTA Ext: No edema, warm Neuro: Alert and oriented, No gross deficits  Assessment and plan:  1. Acute recurrent pansinusitis     Meds ordered this encounter  Medications  . levofloxacin (LEVAQUIN) 500 MG tablet    Sig: Take 1 tablet (500 mg total) daily by mouth.    Dispense:  10 tablet    Refill:  0  . betamethasone acetate-betamethasone sodium phosphate (CELESTONE) injection 6 mg    Consider allergy testing before thinking of ENT surgery. Sinus window or adenoidectomy are sometimes helpful.  Follow up as needed.  Mechele ClaudeWarren Latashia Koch, MD

## 2017-01-22 ENCOUNTER — Ambulatory Visit (INDEPENDENT_AMBULATORY_CARE_PROVIDER_SITE_OTHER): Payer: 59 | Admitting: Nurse Practitioner

## 2017-01-22 ENCOUNTER — Encounter: Payer: Self-pay | Admitting: Nurse Practitioner

## 2017-01-22 ENCOUNTER — Encounter: Payer: Self-pay | Admitting: Family Medicine

## 2017-01-22 VITALS — BP 127/86 | HR 68 | Temp 96.8°F | Ht 71.0 in | Wt 192.2 lb

## 2017-01-22 DIAGNOSIS — R51 Headache: Secondary | ICD-10-CM | POA: Diagnosis not present

## 2017-01-22 DIAGNOSIS — J0141 Acute recurrent pansinusitis: Secondary | ICD-10-CM

## 2017-01-22 DIAGNOSIS — R519 Headache, unspecified: Secondary | ICD-10-CM

## 2017-01-22 MED ORDER — CHLORPHEN-PE-ACETAMINOPHEN 4-10-325 MG PO TABS: 1.0000 | ORAL_TABLET | Freq: Four times a day (QID) | ORAL | 0 refills | Status: DC | PRN

## 2017-01-22 MED ORDER — KETOROLAC TROMETHAMINE 60 MG/2ML IM SOLN
60.0000 mg | Freq: Once | INTRAMUSCULAR | Status: AC
  Administered 2017-01-22: 60 mg via INTRAMUSCULAR

## 2017-01-22 NOTE — Progress Notes (Signed)
Subjective:    Patient ID: Kevin Hughes, male    DOB: [birth year], 31 y.o.   MRN: [id number]  HPI  Patient comes in today with his wife c/o not feeling any better. He saw Dr. Darlyn Hughes on 01/20/17 c/o of upper resp symptoms. He was diagnosed with acute recurrent pansinusitis. He was given a celestone injection and levaquin. his wife called earlier today saying that he was no better. He says that he gets frequent sinus infections. Today he is c/o facial pressure and pain radiates around to back of head. [name]. Rates pain 7-8/10 . His wife said that last time he had a bad sinus infection he develped a migraine and had to come in and get meds for it.  * prior to visit with dr. Darlyn Hughes he did an e visit and was given augmentin. He took it for 5 days and said it did not help him at all.  Review of Systems  Constitutional: Negative for chills and fever.  HENT: Positive for congestion, sinus pressure and sinus pain.   Eyes: Positive for photophobia.  Respiratory: Positive for cough.   Cardiovascular: Negative.   Gastrointestinal: Positive for nausea (only when he is standing for to long).  Neurological: Positive for headaches.  Psychiatric/Behavioral: Negative.   All other systems reviewed and are negative.      Objective:   Physical Exam  Constitutional: He appears well-developed and well-nourished. No distress.  HENT:  Right Ear: Hearing, tympanic membrane, external ear and ear canal normal.  Left Ear: Hearing, tympanic membrane, external ear and ear canal normal.  Nose: Mucosal edema present. Right sinus exhibits maxillary sinus tenderness. Left sinus exhibits maxillary sinus tenderness.  Mouth/Throat: Uvula is midline.  Neck: Normal range of motion. Neck supple.  Cardiovascular: Normal rate and regular rhythm.  Pulmonary/Chest: Effort normal and breath sounds normal.  Abdominal: Soft. Bowel sounds are normal.  Neurological: He is alert.  Skin: Skin  is warm.  Psychiatric: He has a normal mood and affect. His behavior is normal. Judgment and thought content normal.   BP 127/86   Pulse 68   Temp (!) 96.8 F (36 C) (Oral)   Ht 5\' 11"  (1.803 m)   Wt 192 lb 4 oz (87.2 kg)   BMI 26.81 kg/m       Assessment & Plan:   1. Acute recurrent pansinusitis   2. Acute intractable headache, unspecified headache type    Meds ordered this encounter  Medications  . pseudoephedrine-guaifenesin (MUCINEX D) 60-600 MG 12 hr tablet    Sig: Take 1 tablet every 12 (twelve) hours by mouth.  . Chlorphen-PE-Acetaminophen 4-10-325 MG TABS    Sig: Take 1 tablet every 6 (six) hours as needed by mouth.    Dispense:  20 tablet    Refill:  0    Order Specific Question:   Supervising Provider    Answer:   VINCENT, CAROL L [[id number]]  . ketorolac (TORADOL) injection 60 mg    Finish levaquin 1. Take meds as prescribed 2. Use a cool mist humidifier especially during the winter months and when heat has been humid. 3. Use saline nose sprays frequently 4. Saline irrigations of the nose can be very helpful if done frequently.  * 4X daily for 1 week*  * Use of a nettie pot can be helpful with this. Follow directions with this* 5. Drink plenty of fluids 6. Keep thermostat turn down low 7.For any cough or congestion  Use plain Mucinex- regular strength or max strength is fine   * Children- consult with Pharmacist for dosing 8. For fever or aces or pains- take tylenol or ibuprofen appropriate for age and weight.  * for fevers greater than 101 orally you may alternate ibuprofen and tylenol every  3 hours.   Mary-Margaret Daphine DeutscherMartin, FNP

## 2017-01-22 NOTE — Patient Instructions (Signed)

## 2017-01-24 ENCOUNTER — Ambulatory Visit (HOSPITAL_COMMUNITY)
Admission: RE | Admit: 2017-01-24 | Discharge: 2017-01-24 | Disposition: A | Discharge status: Home / Self Care | Payer: 59 | Source: Ambulatory Visit | Attending: Family Medicine | Admitting: Family Medicine

## 2017-01-24 ENCOUNTER — Encounter: Payer: Self-pay | Admitting: Family Medicine

## 2017-01-24 ENCOUNTER — Ambulatory Visit (INDEPENDENT_AMBULATORY_CARE_PROVIDER_SITE_OTHER): Payer: 59 | Admitting: Family Medicine

## 2017-01-24 VITALS — BP 133/81 | HR 75 | Temp 97.1°F | Ht 71.0 in | Wt 192.0 lb

## 2017-01-24 DIAGNOSIS — R51 Headache: Secondary | ICD-10-CM | POA: Diagnosis not present

## 2017-01-24 DIAGNOSIS — I6381 Other cerebral infarction due to occlusion or stenosis of small artery: Secondary | ICD-10-CM | POA: Diagnosis not present

## 2017-01-24 DIAGNOSIS — G4452 New daily persistent headache (NDPH): Secondary | ICD-10-CM | POA: Insufficient documentation

## 2017-01-24 IMAGING — MR MR HEAD W/O CM
7 of 11 series · 31 of 48 positions shown · non-contrast
Comparison: Sinus CT 04/10/2016.  Head CT 07/03/2009

CLINICAL DATA: Headache for 1 week.

EXAM:
MRI HEAD WITHOUT CONTRAST
TECHNIQUE: Multiplanar, multiecho pulse sequences of the brain and surrounding
structures were obtained without intravenous contrast.

[Series 3: DWI · axial · 3.0mm · 0.69mm/px · z∈[-42,+117]mm · 6 of 53 slices shown (1 of 4)]
[im 1/53]
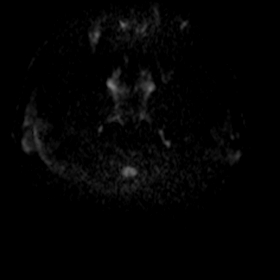
[im 11/53]
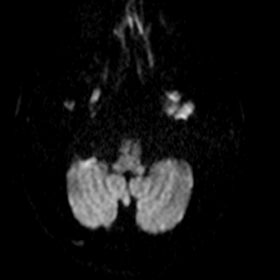
[im 21/53]
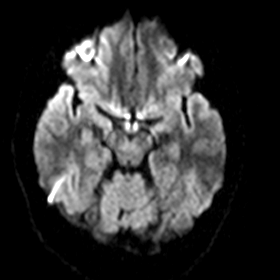
[im 32/53]
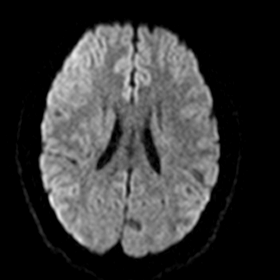
[im 42/53]
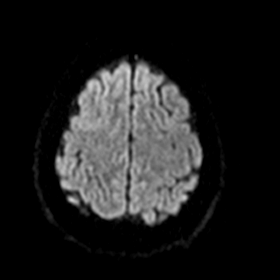
[im 53/53]
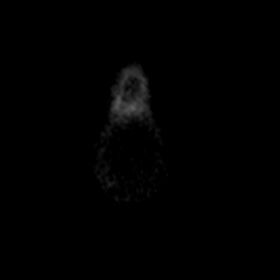

[Series 4: DWI · axial · 3.0mm · 0.73mm/px · z∈[-45,+117]mm · 6 of 55 slices shown (2 of 4)]
[im 1/55]
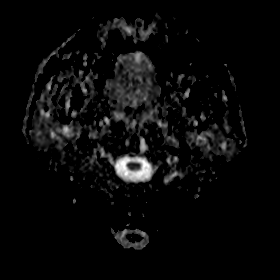
[im 11/55]
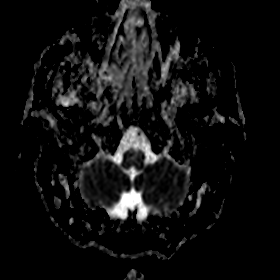
[im 22/55]
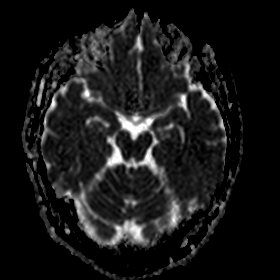
[im 33/55]
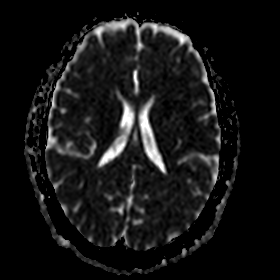
[im 44/55]
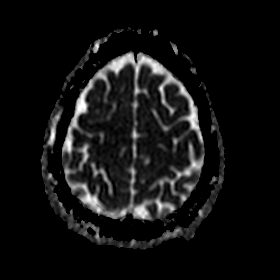
[im 55/55]
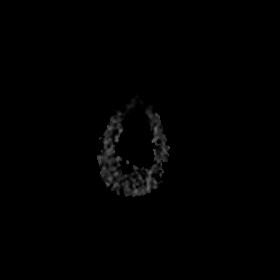

[Series 5: DWI · coronal · 5.0mm · 0.44mm/px · 4 of 38 slices shown (3 of 4)]
[im 1/38]
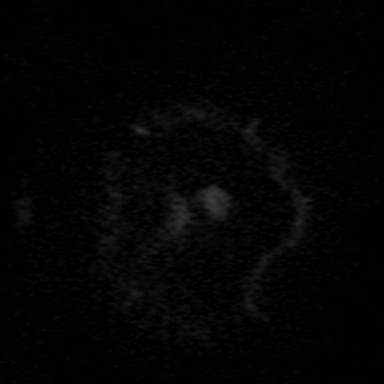
[im 13/38]
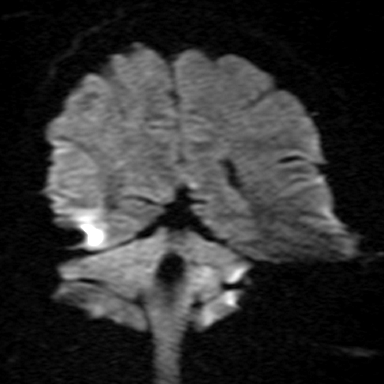
[im 25/38]
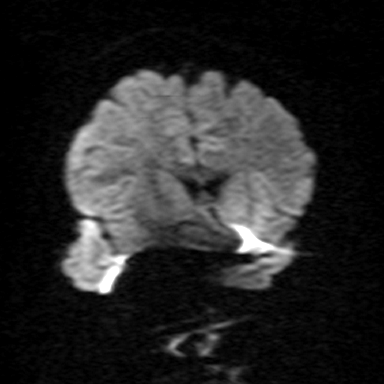
[im 38/38]
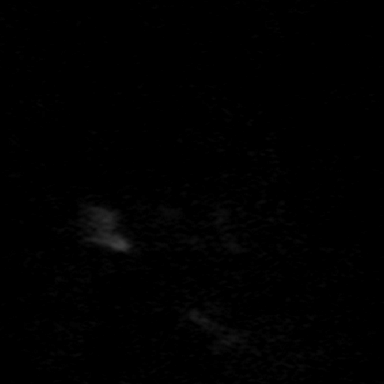

[Series 6: DWI · coronal · 5.0mm · 0.51mm/px · 4 of 38 slices shown (4 of 4)]
[im 1/38]
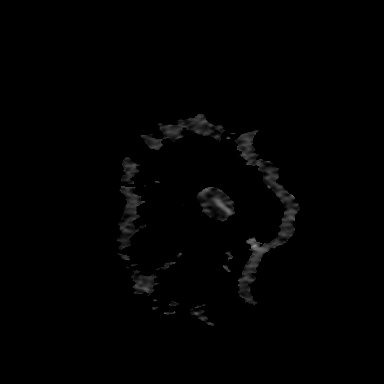
[im 13/38]
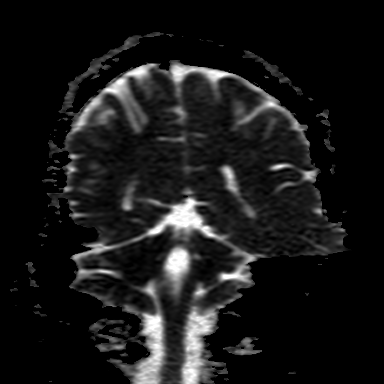
[im 25/38]
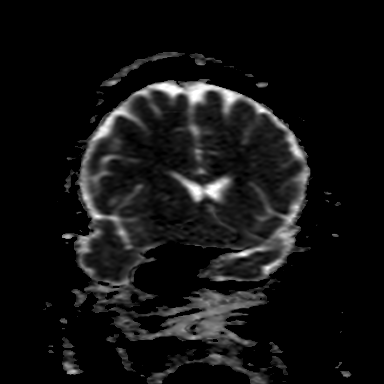
[im 38/38]
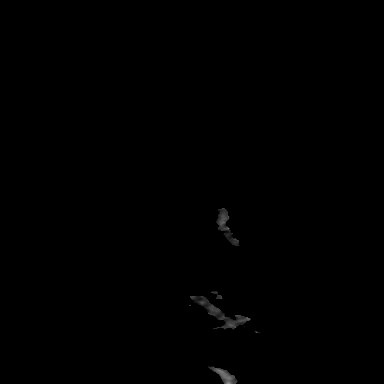

[Series 7: T2 · axial · 5.0mm · 0.45mm/px · z∈[-33,+110]mm · 3 of 23 slices shown (1 of 2)]
[im 1/23]
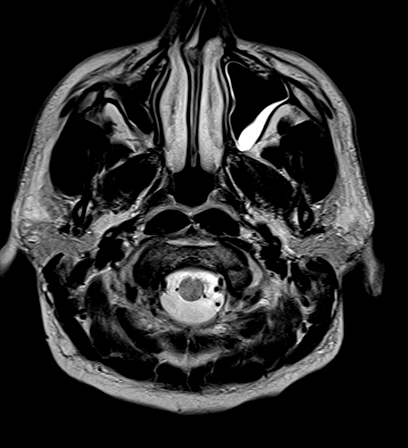
[im 12/23]
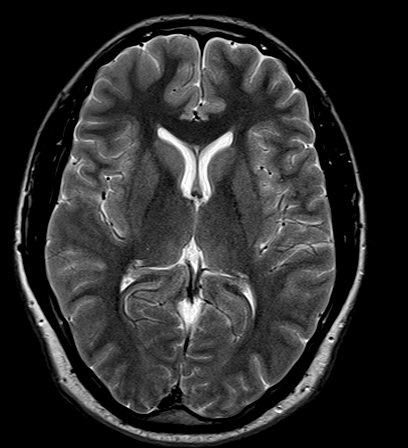
[im 23/23]
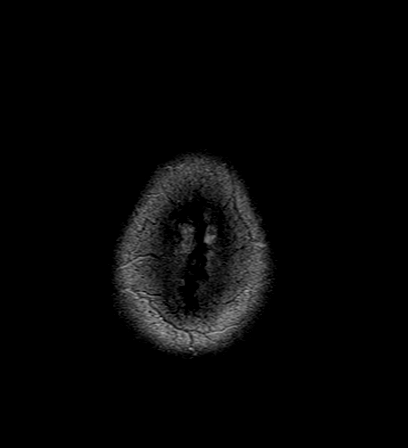

[Series 8: FLAIR · axial · 3.0mm · 0.34mm/px · z∈[-31,+107]mm · 5 of 47 slices shown]
[im 1/47]
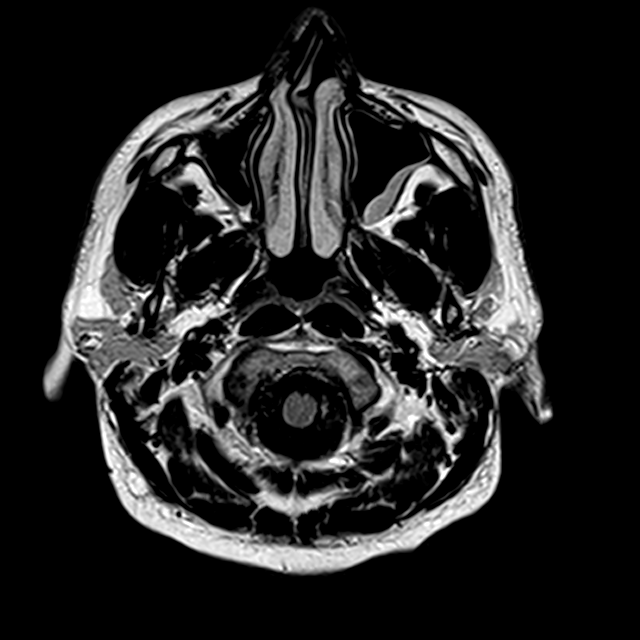
[im 12/47]
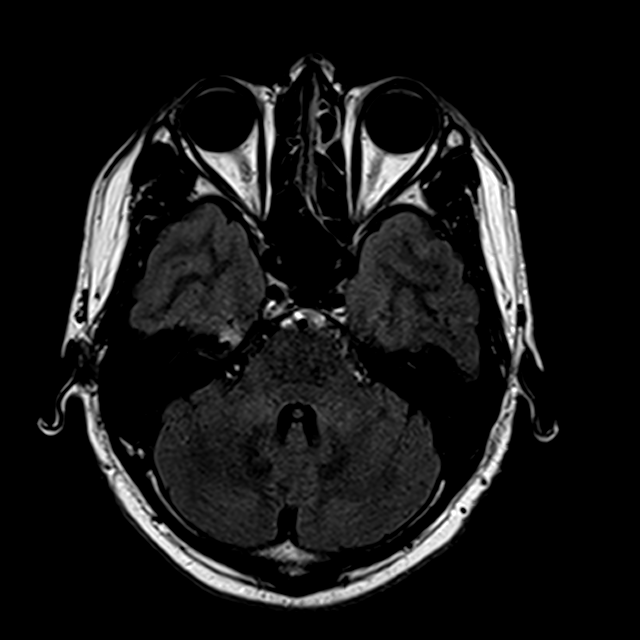
[im 24/47]
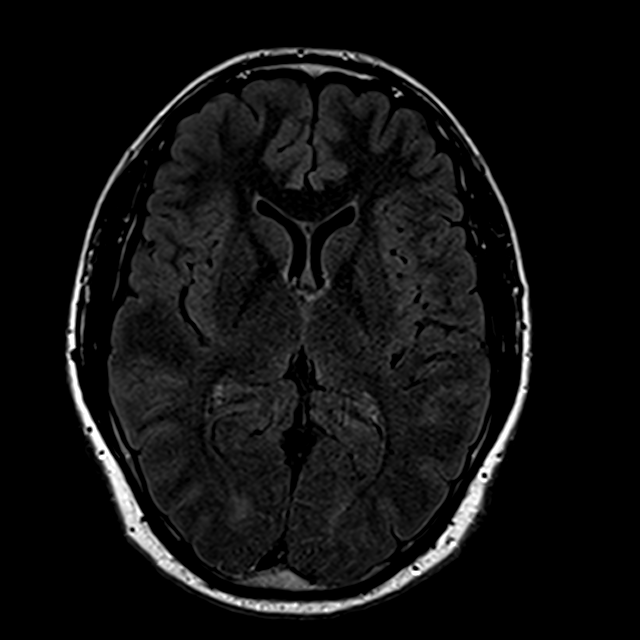
[im 35/47]
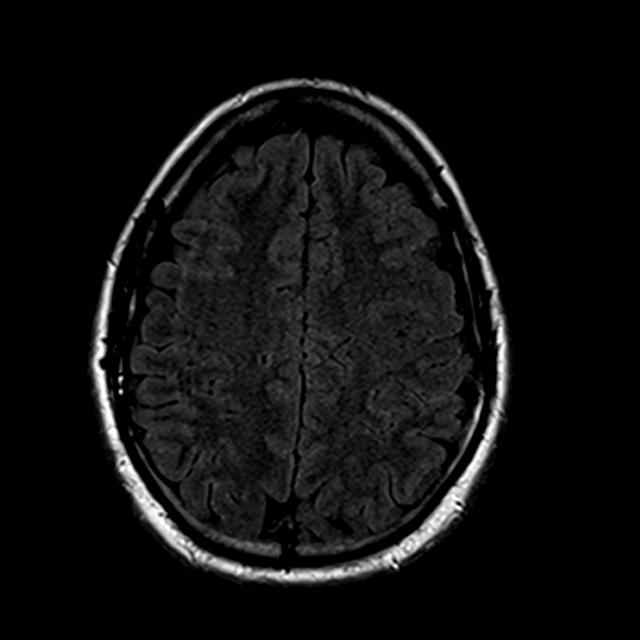
[im 47/47]
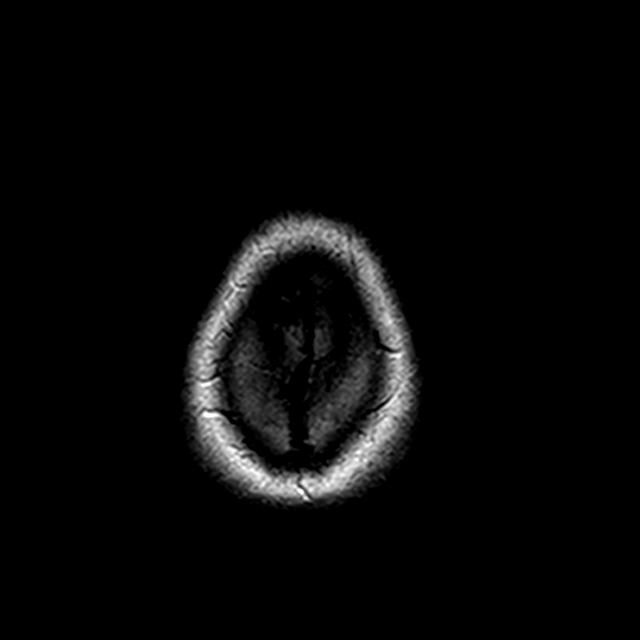

[Series 11: T2 · coronal · 5.0mm · 0.44mm/px · 3 of 28 slices shown (2 of 2)]
[im 1/28]
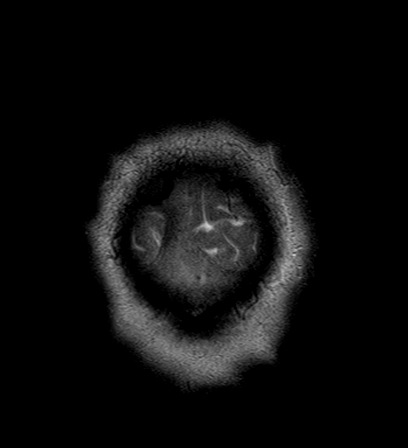
[im 14/28]
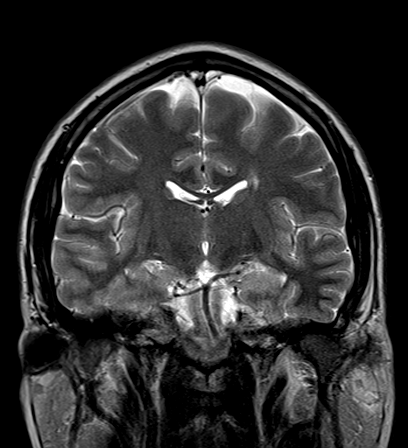
[im 28/28]
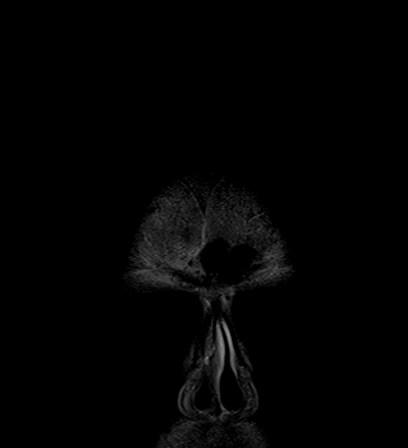

[31 of 48 positions shown; findings below may reference images not displayed]

FINDINGS: Brain: There is no evidence of acute infarct, intracranial
hemorrhage, mass, midline shift, or extra-axial fluid collection. A
6 mm T2 hyperintense focus is noted in the left corona radiata with
evidence of slight focal volume loss consistent with a remote
insult/chronic lacunar infarct. There is a faint subcentimeter T2
hyperintense focus in the right corona radiata, nonspecific. The
brain is otherwise normal in signal.

Vascular: Major intracranial vascular flow voids are preserved.

Skull and upper cervical spine: Unremarkable bone marrow signal.

Sinuses/Orbits: Unremarkable orbits. Mild left ethmoid and left
maxillary sinus mucosal thickening. Small right maxillary sinus.
Clear mastoid air cells.

Other: None.
IMPRESSION: 1. No acute intracranial abnormality.
2. Chronic lacunar infarct in the left corona radiata.

## 2017-01-24 MED ORDER — PREDNISONE 20 MG PO TABS: ORAL_TABLET | ORAL | 0 refills | Status: DC

## 2017-01-24 NOTE — Progress Notes (Signed)
BP 133/81   Pulse 75   Temp (!) 97.1 F (36.2 C) (Oral)   Ht 5\' 11"  (1.803 m)   Wt 192 lb (87.1 kg)   BMI 26.78 kg/m    Subjective:    Patient ID: Kevin Hughes, male    DOB: [birth year], 31 y.o.   MRN: [id number]  HPI: Kevin Hughes is a 31 y.o. male presenting on 01/24/2017 for Headache (ongoing all week, has been seen, given Toradol injection Wednesday evening, still taking Levaquin)   HPI Headache and sinus congestion Patient has been having what he thought was sinus congestion this been going on for 10 days but has worsened over this time.  He was initially treated with Augmentin and then was treated with steroids and Levaquin and then given a shot of Toradol.  He says none of those really helped his headache significantly or helped it improved significantly.  He is noticed that the headache has worsened and is more painful now.  He is starting to develop some photophobia and he has pain across the frontal region going into both temples.  He says when he usually gets migraines it is more in the paranasal region but this time its a lot more spread and a lot longer lasting.  He has gone through courses of 2 different antibiotics along with steroids and Toradol and is just not getting better and is coming in today to see what we can do for him.  He denies any fevers or chills.  He does have some sinus congestion and nasal drainage and a cough.  He denies any sore throat or shortness of breath or wheezing.  He does have photophobia but he denies any focal neurological symptoms.  Relevant past medical, surgical, family and social history reviewed and updated as indicated. Interim medical history since our last visit reviewed. Allergies and medications reviewed and updated.  Review of Systems  Constitutional: Negative for chills and fever.  HENT: Positive for congestion, postnasal drip and sinus pressure. Negative for ear discharge, ear pain, rhinorrhea, sneezing, sore throat and voice change.     Eyes: Negative for pain, discharge, redness and visual disturbance.  Respiratory: Positive for cough. Negative for shortness of breath and wheezing.   Cardiovascular: Negative for chest pain and leg swelling.  Musculoskeletal: Negative for back pain and gait problem.  Skin: Negative for rash.  Neurological: Positive for headaches. Negative for dizziness, weakness, light-headedness and numbness.  All other systems reviewed and are negative.   Per HPI unless specifically indicated above        Objective:    BP 133/81   Pulse 75   Temp (!) 97.1 F (36.2 C) (Oral)   Ht 5\' 11"  (1.803 m)   Wt 192 lb (87.1 kg)   BMI 26.78 kg/m   Wt Readings from Last 3 Encounters:  01/24/17 192 lb (87.1 kg)  01/22/17 192 lb 4 oz (87.2 kg)  01/20/17 197 lb (89.4 kg)    Physical Exam  Constitutional: He is oriented to person, place, and time. He appears well-developed and well-nourished. No distress.  HENT:  Right Ear: Tympanic membrane, external ear and ear canal normal.  Left Ear: Tympanic membrane, external ear and ear canal normal.  Nose: No mucosal edema, rhinorrhea or sinus tenderness. No epistaxis. Right sinus exhibits frontal sinus tenderness. Right sinus exhibits no maxillary sinus tenderness. Left sinus exhibits frontal sinus tenderness. Left sinus exhibits no maxillary sinus tenderness.  Mouth/Throat: Uvula is midline and mucous membranes are normal. Posterior  oropharyngeal edema present. No oropharyngeal exudate, posterior oropharyngeal erythema or tonsillar abscesses.  Eyes: Conjunctivae and EOM are normal. Pupils are equal, round, and reactive to light. Right eye exhibits no discharge. No scleral icterus.  Neck: Neck supple. No thyromegaly present.  Cardiovascular: Normal rate, regular rhythm, normal heart sounds and intact distal pulses.  No murmur heard. Pulmonary/Chest: Effort normal and breath sounds normal. No respiratory distress. He has no wheezes. He has no rales.   Musculoskeletal: Normal range of motion. He exhibits no edema.  Lymphadenopathy:    He has no cervical adenopathy.  Neurological: He is alert and oriented to person, place, and time. Coordination normal.  Skin: Skin is warm and dry. No rash noted. He is not diaphoretic.  Psychiatric: He has a normal mood and affect. His behavior is normal.  Nursing note and vitals reviewed.       Assessment & Plan:   Problem List Items Addressed This Visit    None    Visit Diagnoses    New daily persistent headache    -  Primary   Relevant Medications   predniSONE (DELTASONE) 20 MG tablet   Other Relevant Orders   MR Brain Wo Contrast      Because of the severity and persistence of symptoms we will go ahead and try and get an MRI and give him a longer course of tapering steroids for sinus congestion.  Patient does not have a significant history of headaches or migraines in the past until this past year.  Follow up plan: Return if symptoms worsen or fail to improve.  Counseling provided for all of the vaccine components Orders Placed This Encounter  Procedures  . MR Brain Wo Contrast    Arville CareJoshua Keevon Henney, MD [facility] 01/24/2017, 2:46 PM

## 2017-01-25 ENCOUNTER — Telehealth: Payer: Self-pay

## 2017-01-25 DIAGNOSIS — I6381 Other cerebral infarction due to occlusion or stenosis of small artery: Secondary | ICD-10-CM

## 2017-01-25 NOTE — Telephone Encounter (Signed)
Patient sent an email through MyChart and is [facility] Dr[facility] Marjory LiesPenumalli.  Told him I would pass this info along to you.

## 2017-01-27 ENCOUNTER — Other Ambulatory Visit: Payer: Self-pay | Admitting: *Deleted

## 2017-01-27 DIAGNOSIS — I6381 Other cerebral infarction due to occlusion or stenosis of small artery: Secondary | ICD-10-CM

## 2017-01-27 NOTE — Telephone Encounter (Signed)
Patient calling in who he wants to see at Neurology  Dont see a referral for Neurology

## 2017-03-28 ENCOUNTER — Encounter: Payer: Self-pay | Admitting: Diagnostic Neuroimaging

## 2017-03-28 ENCOUNTER — Ambulatory Visit: Payer: 59 | Admitting: Diagnostic Neuroimaging

## 2017-03-28 VITALS — BP 140/86 | HR 65 | Ht 71.0 in | Wt 202.4 lb

## 2017-03-28 DIAGNOSIS — R519 Headache, unspecified: Secondary | ICD-10-CM

## 2017-03-28 DIAGNOSIS — R51 Headache: Secondary | ICD-10-CM | POA: Diagnosis not present

## 2017-03-28 DIAGNOSIS — R930 Abnormal findings on diagnostic imaging of skull and head, not elsewhere classified: Secondary | ICD-10-CM | POA: Diagnosis not present

## 2017-03-28 DIAGNOSIS — G43009 Migraine without aura, not intractable, without status migrainosus: Secondary | ICD-10-CM | POA: Diagnosis not present

## 2017-03-28 MED ORDER — RIZATRIPTAN BENZOATE 10 MG PO TBDP: 10.0000 mg | ORAL_TABLET | ORAL | 6 refills | Status: DC | PRN

## 2017-03-28 NOTE — Patient Instructions (Addendum)
Thank you for coming to see [place] at [facility]. I hope we have been able to provide you high quality care today.  You may receive a patient satisfaction survey over the next few weeks. We would appreciate your feedback and comments so that we may continue to improve ourselves and the health of our patients.  - repeat MRI brain in 6 months  - rizatriptan 68m as needed for breakthrough headache; may repeat x 1 after 2 hours; max 2 tabs per day or 8 per month  - To prevent or relieve headaches, try the following:   Cool Compress. Lie down and place a cool compress on your head.   Avoid headache triggers. If certain foods or odors seem to have triggered your migraines in the past, avoid them. A headache diary might help you identify triggers.   Include physical activity in your daily routine.   Manage stress. Find healthy ways to cope with the stressors, such as delegating tasks on your to-do list.   Practice relaxation techniques. Try deep breathing, yoga, massage and visualization.   Eat regularly. Eating regularly scheduled meals and maintaining a healthy diet might help prevent headaches. Also, drink plenty of fluids.   Follow a regular sleep schedule. Sleep deprivation might contribute to headaches  Consider biofeedback. With this mind-body technique, you learn to control certain bodily functions - such as muscle tension, heart rate and blood pressure - to prevent headaches or reduce headache pain.    ~~~~~~~~~~~~~~~~~~~~~~~~~~~~~~~~~~~~~~~~~~~~~~~~~~~~~~~~~~~~~~~~~  DR. Mckynna Vanloan'S GUIDE TO HAPPY AND HEALTHY LIVING These are some of my general health and wellness recommendations. Some of them may apply to you better than others. Please use common sense as you try these suggestions and feel free to ask me any questions.   ACTIVITY/FITNESS Mental, social, emotional and physical stimulation are very important for brain and body health. Try learning a new  activity (arts, music, language, sports, games).  Keep moving your body to the best of your abilities. You can do this at home, inside or outside, the park, community center, gym or anywhere you like. Consider a physical therapist or personal trainer to get started. Consider the app Sworkit. Fitness trackers such as smart-watches, smart-phones or Fitbits can help as well.   NUTRITION Eat more plants: colorful vegetables, nuts, seeds and berries.  Eat less sugar, salt, preservatives and processed foods.  Avoid toxins such as cigarettes and alcohol.  Drink water when you are thirsty. Warm water with a slice of lemon is an excellent morning drink to start the day.  Consider these websites for more information The Nutrition Source (hhttps://www.henry-hernandez.biz/ Precision Nutrition (wWindowBlog.ch   RELAXATION Consider practicing mindfulness meditation or other relaxation techniques such as deep breathing, prayer, yoga, tai chi, massage. See website mindful.org or the apps Headspace or Calm to help get started.   SLEEP Try to get at least 7-8+ hours sleep per day. Regular exercise and reduced caffeine will help you sleep better. Practice good sleep hygeine techniques. See website sleep.org for more information.   PLANNING Prepare estate planning, living will, healthcare POA documents. Sometimes this is best planned with the help of an attorney. Theconversationproject.org and agingwithdignity.org are excellent resources.

## 2017-03-28 NOTE — Progress Notes (Signed)
[facility]  PATIENT: Kevin Hughes DOB: [birth year]  REFERRING CLINICIAN: Dettinger, J HISTORY FROM: patient and wife  REASON FOR VISIT: new consult    HISTORICAL  CHIEF COMPLAINT:  Chief Complaint  Patient presents with  . Follow-up  . Cerebrovascular Accident    finding from MRI done for sinus problems.  No sx. wife with pt.    HISTORY OF PRESENT ILLNESS:   32 year old male here for evaluation of headaches and abnormal MRI brain.  For past 1 year patient has had onset of intermittent frontal, occipital and neck pain headache sensations, with aching soreness sensation, lasting hours or up to 2 weeks at a time.  Sometimes associated with sinus congestion and pressure, dizziness (spinning sensation), nausea, photophobia and phonophobia.  Patient has had symptoms 6 out of the last 12 months.  Symptoms seem to be aggravated by sinus problems and allergies.  Sometimes if he misses early morning caffeine this may trigger headache.  No prior similar headaches in the past.  No family history of migraine. Patient does have history of motion sickness and car sickness since childhood.  No prodromal vision changes with his headaches.  Due to new onset and persistent nature of headaches, PCP ordered MRI of the brain which showed a small left frontal subcortical lesion, concerning for possible chronic lacunar stroke, and therefore patient was referred here for evaluation.  Patient denies any unilateral numbness, weakness, slurred speech, trouble talking, vision loss, coordination problems symptoms in the past.  He was born full-term with normal birth and development.  No prior head injuries or head traumas.  Patient has used ibuprofen, Tylenol, Excedrin Migraine with mild relief of headaches.  He was given Toradol injection by PCP which seemed to help a little bit.  Patient drinks 3-4 caffeine drinks per day.    REVIEW OF SYSTEMS: Full 14 system review of systems performed  and negative with exception of: Snoring.  Otherwise negative.  ALLERGIES: No Known Allergies  HOME MEDICATIONS: Outpatient Medications Prior to Visit  Medication Sig Dispense Refill  . cetirizine (ZYRTEC) 10 MG tablet Take 1 tablet (10 mg total) by mouth daily. For allergy and headache prevention 30 tablet 11  . fluticasone (FLONASE) 50 MCG/ACT nasal spray Place 2 sprays into both nostrils daily. 16 g 6  . Chlorphen-PE-Acetaminophen 4-10-325 MG TABS Take 1 tablet every 6 (six) hours as needed by mouth. 20 tablet 0  . levofloxacin (LEVAQUIN) 500 MG tablet Take 1 tablet (500 mg total) daily by mouth. 10 tablet 0  . predniSONE (DELTASONE) 20 MG tablet Take 3 tabs daily for 1 week, then 2 tabs daily for week 2, then 1 tab daily for week 3. 42 tablet 0  . pseudoephedrine-guaifenesin (MUCINEX D) 60-600 MG 12 hr tablet Take 1 tablet every 12 (twelve) hours by mouth.     No facility-administered medications prior to visit.     PAST MEDICAL HISTORY: Past Medical History:  Diagnosis Date  . Stroke [facility])     PAST SURGICAL HISTORY: Past Surgical History:  Procedure Laterality Date  . FINGER SURGERY    . WISDOM TOOTH EXTRACTION      FAMILY HISTORY: Family History  Problem Relation Age of Onset  . Hypertension Mother   . Throat cancer Paternal Grandfather   . Diabetes Paternal Grandfather     SOCIAL HISTORY:  Social History   Socioeconomic History  . Marital status: Married    Spouse name: Not on file  . Number of children: Not on  file  . Years of education: Not on file  . Highest education level: Not on file  Social Needs  . Financial resource strain: Not on file  . Food insecurity - worry: Not on file  . Food insecurity - inability: Not on file  . Transportation needs - medical: Not on file  . Transportation needs - non-medical: Not on file  Occupational History  . Not on file  Tobacco Use  . Smoking status: Never Smoker  . Smokeless tobacco: Never Used  Substance and  Sexual Activity  . Alcohol use: No  . Drug use: No  . Sexual activity: Not on file  Other Topics Concern  . Not on file  Social History Narrative   ** Merged History Encounter **    Lives at home with wife, son and mother in [employer]nel [employer] education     PHYSICAL EXAM  GENERAL EXAM/CONSTITUTIONAL: Vitals:  Vitals:   03/28/17 0803  BP: 140/86  Pulse: 65  Weight: 202 lb 6.4 oz (91.8 kg)  Height: '5\' 11"'  (1.803 m)     Body mass index is 28.23 kg/m.  No exam data present  Patient is in no distress; well developed, nourished and groomed; neck is supple  CARDIOVASCULAR:  Examination of carotid arteries is normal; no carotid bruits  Regular rate and rhythm, no murmurs  Examination of peripheral vascular system by observation and palpation is normal  EYES:  Ophthalmoscopic exam of optic discs and posterior segments is normal; no papilledema or hemorrhages  MUSCULOSKELETAL:  Gait, strength, tone, movements noted in Neurologic exam below  NEUROLOGIC: MENTAL STATUS:  No flowsheet data found.  awake, alert, oriented to person, place and time  recent and remote memory intact  normal attention and concentration  language fluent, comprehension intact, naming intact,   fund of knowledge appropriate  CRANIAL NERVE:   2nd - no papilledema on fundoscopic exam  2nd, 3rd, 4th, 6th - pupils equal and reactive to light, visual fields full to confrontation, extraocular muscles intact, no nystagmus  5th - facial sensation symmetric  7th - facial strength symmetric  8th - hearing intact  9th - palate elevates symmetrically, uvula midline  11th - shoulder shrug symmetric  12th - tongue protrusion midline  MOTOR:   normal bulk and tone, full strength in the BUE, BLE  SENSORY:   normal and symmetric to light touch, temperature, vibration  COORDINATION:   finger-nose-finger, fine finger movements normal  REFLEXES:   deep tendon  reflexes present and symmetric  GAIT/STATION:   narrow based gait; romberg is negative    DIAGNOSTIC DATA (LABS, IMAGING, TESTING) - I reviewed patient records, labs, notes, testing and imaging myself where available.  Lab Results  Component Value Date   WBC 12.1 (H) 07/03/2009   HGB 13.9 12/16/2015   HCT 41.0 12/16/2015   MCV 87.8 07/03/2009   PLT 260 07/03/2009      Component Value Date/Time   NA 144 12/16/2015 1621   K 3.6 12/16/2015 1621   CL 104 12/16/2015 1621   CO2 27 07/03/2009 1836   GLUCOSE 95 12/16/2015 1621   BUN 14 12/16/2015 1621   CREATININE 0.90 12/16/2015 1621   CALCIUM 9.2 07/03/2009 1836   PROT 7.1 07/03/2009 1836   ALBUMIN 4.4 07/03/2009 1836   AST 71 (H) 07/03/2009 1836   ALT 72 (H) 07/03/2009 1836   ALKPHOS 75 07/03/2009 1836   BILITOT 0.6 07/03/2009 1836   GFRNONAA >60 07/03/2009 1836  GFRAA  07/03/2009 1836    >60        The eGFR has been calculated using the MDRD equation. This calculation has not been validated in all clinical situations. eGFR's persistently <60 mL/min signify possible Chronic Kidney Disease.   No results found for: CHOL, HDL, LDLCALC, LDLDIRECT, TRIG, CHOLHDL No results found for: HGBA1C No results found for: VITAMINB12 No results found for: TSH   01/24/17 MRI brain [I reviewed images myself. There is non-specific left corona radiata gliosis; no clinical correlation with this lesion. Likely incidental finding. -VRP]  1. No acute intracranial abnormality. 2. 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     ASSESSMENT AND PLAN  32 y.o. year old male here with new onset headaches in the past 1 year, with migraine features.  Also with abnormal MRI of the brain with nonspecific gliosis in the left corona radiata, likely an incidental finding.  Offered migraine prevention and migraine rescue medications, but patient would like to try migraine rescue medicine only at this time.  Also will repeat MRI of the  brain in 6 months to ensure stability of lesion.   Dx:  1. Migraine without aura and without status migrainosus, not intractable   2. Chronic nonintractable headache, unspecified headache type   3. Abnormal MRI of head      PLAN:  - repeat MRI brain in 6 months  - rizatriptan 76m as needed for breakthrough headache; may repeat x 1 after 2 hours; max 2 tabs per day or 8 per month  - To prevent or relieve headaches, try the following:   Cool Compress. Lie down and place a cool compress on your head.   Avoid headache triggers. If certain foods or odors seem to have triggered your migraines in the past, avoid them. A headache diary might help you identify triggers.   Include physical activity in your daily routine.   Manage stress. Find healthy ways to cope with the stressors, such as delegating tasks on your to-do list.   Practice relaxation techniques. Try deep breathing, yoga, massage and visualization.   Eat regularly. Eating regularly scheduled meals and maintaining a healthy diet might help prevent headaches. Also, drink plenty of fluids.   Follow a regular sleep schedule. Sleep deprivation might contribute to headaches  Consider biofeedback. With this mind-body technique, you learn to control certain bodily functions - such as muscle tension, heart rate and blood pressure - to prevent headaches or reduce headache pain.   Orders Placed This Encounter  Procedures  . MR BRAIN W WO CONTRAST    Meds ordered this encounter  Medications  . rizatriptan (MAXALT-MLT) 10 MG disintegrating tablet    Sig: Take 1 tablet (10 mg total) by mouth as needed for migraine. May repeat in 2 hours if needed    Dispense:  9 tablet    Refill:  6   Return in about 6 months (around 09/25/2017).    VPenni Bombard MD 13/84/5364 86:80AM Certified in Neurology, Neurophysiology and Neuroimaging  [facility] [address] [place]  [phone]

## 2017-05-13 ENCOUNTER — Encounter: Payer: Self-pay | Admitting: Family Medicine

## 2017-05-13 ENCOUNTER — Ambulatory Visit: Payer: 59 | Admitting: Family Medicine

## 2017-05-13 VITALS — BP 116/83 | HR 91 | Temp 98.2°F | Ht 71.0 in | Wt 200.0 lb

## 2017-05-13 DIAGNOSIS — J0111 Acute recurrent frontal sinusitis: Secondary | ICD-10-CM

## 2017-05-13 DIAGNOSIS — R51 Headache: Secondary | ICD-10-CM | POA: Diagnosis not present

## 2017-05-13 DIAGNOSIS — R519 Headache, unspecified: Secondary | ICD-10-CM

## 2017-05-13 MED ORDER — BETAMETHASONE SOD PHOS & ACET 6 (3-3) MG/ML IJ SUSP
6.0000 mg | Freq: Once | INTRAMUSCULAR | Status: AC
  Administered 2017-05-13: 6 mg via INTRAMUSCULAR

## 2017-05-13 MED ORDER — AMOXICILLIN-POT CLAVULANATE 875-125 MG PO TABS: 1.0000 | ORAL_TABLET | Freq: Two times a day (BID) | ORAL | 0 refills | Status: DC

## 2017-05-13 NOTE — Progress Notes (Signed)
Subjective:  Patient ID: Madelin HeadingsMichael Yurick, male    DOB: 1986/02/23  Age: 32 y.o. MRN: [id number]  CC: Headache   HPI Madelin HeadingsMichael Edmonds presents for Excruciating frontal migraine associated with light headed dizziness. Increasing over the last several days. HA is a pounding centered to the left of the midline forehead. Denies visual changes, but has been off balance.   [facility] 05/15/2017 05/13/2017 01/24/2017  Decreased Interest 0 0 0  Down, Depressed, Hopeless 0 0 0  PHQ - 2 Score 0 0 0    History Casimiro NeedleMichael has a past medical history of Stroke (HCC).   He has a past surgical history that includes Wisdom tooth extraction and Finger surgery.   His family history includes Diabetes in his paternal grandfather; Hypertension in his mother; Throat cancer in his paternal grandfather.He reports that  has never smoked. he has never used smokeless tobacco. He reports that he does not drink alcohol or use drugs.    ROS Review of Systems  Constitutional: Negative for activity change, appetite change, chills and fever.  HENT: Positive for congestion, postnasal drip, rhinorrhea and sinus pressure. Negative for ear discharge, ear pain, hearing loss, nosebleeds, sneezing and trouble swallowing.   Respiratory: Negative for chest tightness and shortness of breath.   Cardiovascular: Negative for chest pain and palpitations.  Skin: Negative for rash.  Neurological: Positive for dizziness and light-headedness. Negative for syncope and speech difficulty.  Psychiatric/Behavioral: Negative for agitation, confusion and decreased concentration.    Objective:  BP 116/83   Pulse 91   Temp 98.2 F (36.8 C) (Oral)   Ht 5\' 11"  (1.803 m)   Wt 200 lb (90.7 kg)   BMI 27.89 kg/m   BP Readings from Last 3 Encounters:  05/15/17 135/86  05/13/17 116/83  03/28/17 140/86    Wt Readings from Last 3 Encounters:  05/15/17 200 lb 6.4 oz (90.9 kg)  05/13/17 200 lb (90.7 kg)  03/28/17 202 lb 6.4 oz (91.8  kg)     Physical Exam  Constitutional: He appears well-developed and well-nourished.  HENT:  Head: Normocephalic and atraumatic.  Right Ear: Tympanic membrane and external ear normal. No decreased hearing is noted.  Left Ear: Tympanic membrane and external ear normal. No decreased hearing is noted.  Mouth/Throat: No oropharyngeal exudate or posterior oropharyngeal erythema.  Eyes: Pupils are equal, round, and reactive to light.  Neck: Normal range of motion. Neck supple.  Cardiovascular: Normal rate and regular rhythm.  No murmur heard. Pulmonary/Chest: Breath sounds normal. No respiratory distress.  Abdominal: Soft. Bowel sounds are normal. He exhibits no mass. There is no tenderness.  Skin: Skin is warm and dry.  Psychiatric: His speech is normal and behavior is normal. Thought content normal. His mood appears anxious. Cognition and memory are normal.  Vitals reviewed.     Assessment & Plan:   Casimiro NeedleMichael was seen today for headache.  Diagnoses and all orders for this visit:  Acute recurrent frontal sinusitis -     betamethasone acetate-betamethasone sodium phosphate (CELESTONE) injection 6 mg  Acute intractable headache, unspecified headache type  Other orders -     amoxicillin-clavulanate (AUGMENTIN) 875-125 MG tablet; Take 1 tablet by mouth 2 (two) times daily.       I am having Madelin HeadingsMichael Boer start on amoxicillin-clavulanate. I am also having him maintain his cetirizine, fluticasone, and rizatriptan. We administered betamethasone acetate-betamethasone sodium phosphate.  Allergies as of 05/13/2017   No Known Allergies     Medication List  Accurate as of 05/13/17 11:59 PM. Always use your most recent med list.          amoxicillin-clavulanate 875-125 MG tablet Commonly known as:  AUGMENTIN Take 1 tablet by mouth 2 (two) times daily.   cetirizine 10 MG tablet Commonly known as:  ZYRTEC Take 1 tablet (10 mg total) by mouth daily. For allergy and headache  prevention   fluticasone 50 MCG/ACT nasal spray Commonly known as:  FLONASE Place 2 sprays into both nostrils daily.   rizatriptan 10 MG disintegrating tablet Commonly known as:  MAXALT-MLT Take 1 tablet (10 mg total) by mouth as needed for migraine. May repeat in 2 hours if needed        Follow-up: Return if symptoms worsen or fail to improve.  Mechele Claude, M.D.

## 2017-05-15 ENCOUNTER — Ambulatory Visit: Payer: 59 | Admitting: Family Medicine

## 2017-05-15 ENCOUNTER — Encounter: Payer: Self-pay | Admitting: Family Medicine

## 2017-05-15 VITALS — BP 135/86 | HR 80 | Temp 97.7°F | Ht 71.0 in | Wt 200.4 lb

## 2017-05-15 DIAGNOSIS — R519 Headache, unspecified: Secondary | ICD-10-CM

## 2017-05-15 DIAGNOSIS — R51 Headache: Secondary | ICD-10-CM | POA: Diagnosis not present

## 2017-05-15 MED ORDER — KETOROLAC TROMETHAMINE 60 MG/2ML IM SOLN
60.0000 mg | Freq: Once | INTRAMUSCULAR | Status: AC
  Administered 2017-05-15: 60 mg via INTRAMUSCULAR

## 2017-05-15 MED ORDER — CYCLOBENZAPRINE HCL 10 MG PO TABS: 10.0000 mg | ORAL_TABLET | Freq: Three times a day (TID) | ORAL | 0 refills | Status: DC | PRN

## 2017-05-15 MED ORDER — PREDNISONE 10 MG PO TABS: ORAL_TABLET | ORAL | 0 refills | Status: DC

## 2017-05-15 NOTE — Patient Instructions (Signed)
Great to meet you!  Take flexeril at night for about 1 week

## 2017-05-15 NOTE — Progress Notes (Signed)
   HPI  Patient presents today here with headache.  Patient states that he was seen 2 days ago and treated for sinusitis.  He states that his sinuses are improving slightly but now he has when he feels to be a migraine.  800 mg of ibuprofen, thousand milligrams of Tylenol, Maxalt have all been ineffective. He has taken a Benadryl like sinus medication which seemed to help somewhat, however he slept well after that and is unsure.  He feels a little dizzy. He states that his headache seems to be in the bilateral parietal temporal areas  PMH: Smoking status noted ROS: Per HPI  Objective: BP 135/86   Pulse 80   Temp 97.7 F (36.5 C) (Oral)   Ht 5\' 11"  (1.803 m)   Wt 200 lb 6.4 oz (90.9 kg)   BMI 27.95 kg/m  Gen: NAD, alert, cooperative with exam HEENT: NCAT, positive tenderness to palpation of bilateral TMJs, oropharynx moist and clear, positive tenderness to palpation of bilateral maxillary and frontal sinuses CV: RRR, good S1/S2, no murmur Resp: CTABL, no wheezes, non-labored Ext: No edema, warm Neuro: Alert and oriented, No gross deficits  Assessment and plan:  #Headache Multifactorial headache, patient with chronic migraine syndrome, current sinus infection, and tenderness at the TMJs. I believe that he is actually having some muscle spasms of the temporalis muscles bilaterally from the TMJ issue.  He states that he actually feels a little bit better with eating. Flexeril at night Long course of prednisone Also given IM Toradol today   Meds ordered this encounter  Medications  . cyclobenzaprine (FLEXERIL) 10 MG tablet    Sig: Take 1 tablet (10 mg total) by mouth 3 (three) times daily as needed for muscle spasms.    Dispense:  30 tablet    Refill:  0  . predniSONE (DELTASONE) 10 MG tablet    Sig: Take 4 pills a day for 3 days, then 3 pills a day for 3 days, then 2 pills a day for 3 days, then 1 pill a day for 3 days, then stop    Dispense:  30 tablet    Refill:  0    . ketorolac (TORADOL) injection 60 mg    Murtis SinkSam Rema Lievanos, MD [facility] 05/15/2017, 3:59 PM

## 2017-05-19 ENCOUNTER — Encounter: Payer: Self-pay | Admitting: Family Medicine

## 2017-05-19 ENCOUNTER — Other Ambulatory Visit: Payer: Self-pay | Admitting: Family Medicine

## 2017-05-19 MED ORDER — LEVOFLOXACIN 500 MG PO TABS: 500.0000 mg | ORAL_TABLET | Freq: Every day | ORAL | 0 refills | Status: DC

## 2017-05-22 ENCOUNTER — Encounter: Payer: Self-pay | Admitting: Family Medicine

## 2017-08-19 ENCOUNTER — Encounter: Payer: Self-pay | Admitting: Family Medicine

## 2017-08-19 ENCOUNTER — Ambulatory Visit (INDEPENDENT_AMBULATORY_CARE_PROVIDER_SITE_OTHER): Payer: 59 | Admitting: Family Medicine

## 2017-08-19 VITALS — BP 128/87 | HR 58 | Temp 97.0°F | Ht 71.0 in | Wt 198.2 lb

## 2017-08-19 DIAGNOSIS — Z0000 Encounter for general adult medical examination without abnormal findings: Secondary | ICD-10-CM

## 2017-08-19 DIAGNOSIS — Z23 Encounter for immunization: Secondary | ICD-10-CM

## 2017-08-19 DIAGNOSIS — R5383 Other fatigue: Secondary | ICD-10-CM

## 2017-08-19 LAB — URINALYSIS
BILIRUBIN UA: NEGATIVE
Glucose, UA: NEGATIVE
Ketones, UA: NEGATIVE
Leukocytes, UA: NEGATIVE
NITRITE UA: NEGATIVE
PH UA: 8.5 — AB (ref 5.0–7.5)
Protein, UA: NEGATIVE
RBC UA: NEGATIVE
SPEC GRAV UA: 1.015 (ref 1.005–1.030)
Urobilinogen, Ur: 0.2 mg/dL (ref 0.2–1.0)

## 2017-08-19 MED ORDER — METRONIDAZOLE 1 % EX GEL: Freq: Every day | CUTANEOUS | 11 refills | Status: DC

## 2017-08-19 NOTE — Patient Instructions (Signed)

## 2017-08-19 NOTE — Progress Notes (Signed)
Subjective:  Patient ID: Kevin Hughes, male    DOB: 1985-06-26  Age: 32 y.o. MRN: [id number]  CC: Annual Exam   HPI [name] presents for physical exam.   Depression screen [facility] 2/9 05/15/2017 05/13/2017 01/24/2017  Decreased Interest 0 0 0  Down, Depressed, Hopeless 0 0 0  PHQ - 2 Score 0 0 0    History [name] has a past medical history of Stroke ([place]).   He has a past surgical history that includes Wisdom tooth extraction and Finger surgery.   His family history includes Diabetes in his paternal grandfather; Hypertension in his mother; Throat cancer in his paternal grandfather.He reports that he has never smoked. He has never used smokeless tobacco. He reports that he does not drink alcohol or use drugs.    ROS Review of Systems  Constitutional: Positive for fatigue.  HENT: Negative.   Eyes: Negative for visual disturbance.  Respiratory: Negative for cough and shortness of breath.   Cardiovascular: Negative for chest pain and leg swelling.  Gastrointestinal: Negative for abdominal pain, diarrhea, nausea and vomiting.  Genitourinary: Negative for difficulty urinating.  Musculoskeletal: Negative for arthralgias and myalgias.  Skin: Negative for rash.  Neurological: Negative for headaches.  Psychiatric/Behavioral: Negative for sleep disturbance.    Objective:  BP 128/87   Pulse (!) 58   Temp (!) 97 F (36.1 C) (Oral)   Ht _0  (1.803 m)   Wt 198 lb 4 oz (89.9 kg)   BMI 27.65 kg/m   BP Readings from Last 3 Encounters:  08/19/17 128/87  05/15/17 135/86  05/13/17 116/83    Wt Readings from Last 3 Encounters:  08/19/17 198 lb 4 oz (89.9 kg)  05/15/17 200 lb 6.4 oz (90.9 kg)  05/13/17 200 lb (90.7 kg)     Physical Exam  Constitutional: He is oriented to person, place, and time. He appears well-developed and well-nourished.  HENT:  Head: Normocephalic and atraumatic.  Mouth/Throat: Oropharynx is clear and moist.  Eyes: Pupils are equal, round, and  reactive to light. EOM are normal.  Neck: Normal range of motion. No tracheal deviation present. No thyromegaly present.  Cardiovascular: Normal rate, regular rhythm and normal heart sounds. Exam reveals no gallop and no friction rub.  No murmur heard. Pulmonary/Chest: Breath sounds normal. He has no wheezes. He has no rales.  Abdominal: Soft. He exhibits no mass. There is no tenderness.  Musculoskeletal: Normal range of motion. He exhibits no edema.  Neurological: He is alert and oriented to person, place, and time.  Skin: Skin is warm and dry.  Psychiatric: He has a normal mood and affect.      Assessment & Plan:   [name] was seen today for annual exam.  Diagnoses and all orders for this visit:  Well adult exam -     CBC with Differential/Platelet -     CMP14+EGFR -     Lipid panel -     Urinalysis  Other fatigue -     Testosterone,Free and Total  Other orders -     Tdap vaccine greater than or equal to 7yo IM -     metroNIDAZOLE (METROGEL) 1 % gel; Apply topically daily. For roscea       I have discontinued Lerone Gilvin's amoxicillin-clavulanate, predniSONE, and levofloxacin. I am also having him start on metroNIDAZOLE. Additionally, I am having him maintain his cetirizine, fluticasone, rizatriptan, and cyclobenzaprine.  Allergies as of 08/19/2017   No Known Allergies     Medication List  Accurate as of 08/19/17 11:59 PM. Always use your most recent med list.          cetirizine 10 MG tablet Commonly known as:  ZYRTEC Take 1 tablet (10 mg total) by mouth daily. For allergy and headache prevention   cyclobenzaprine 10 MG tablet Commonly known as:  FLEXERIL Take 1 tablet (10 mg total) by mouth 3 (three) times daily as needed for muscle spasms.   fluticasone 50 MCG/ACT nasal spray Commonly known as:  FLONASE Place 2 sprays into both nostrils daily.   metroNIDAZOLE 1 % gel Commonly known as:  METROGEL Apply topically daily. For roscea   rizatriptan  10 MG disintegrating tablet Commonly known as:  MAXALT-MLT Take 1 tablet (10 mg total) by mouth as needed for migraine. May repeat in 2 hours if needed        Follow-up: Return in about 1 year (around 08/20/2018).  Claretta Fraise, M.D.

## 2017-08-20 LAB — LIPID PANEL
CHOLESTEROL TOTAL: 145 mg/dL (ref 100–199)
Chol/HDL Ratio: 3.3 ratio (ref 0.0–5.0)
HDL: 44 mg/dL (ref >39)
LDL CALC: 83 mg/dL (ref 0–99)
TRIGLYCERIDES: 91 mg/dL (ref 0–149)
VLDL Cholesterol Cal: 18 mg/dL (ref 5–40)

## 2017-08-20 LAB — CMP14+EGFR
ALK PHOS: 78 IU/L (ref 39–117)
ALT: 37 IU/L (ref 0–44)
AST: 23 IU/L (ref 0–40)
Albumin/Globulin Ratio: 1.9 (ref 1.2–2.2)
Albumin: 4.3 g/dL (ref 3.5–5.5)
BUN/Creatinine Ratio: 12 (ref 9–20)
BUN: 11 mg/dL (ref 6–20)
Bilirubin Total: 0.4 mg/dL (ref 0.0–1.2)
CO2: 24 mmol/L (ref 20–29)
CREATININE: 0.93 mg/dL (ref 0.76–1.27)
Calcium: 9.2 mg/dL (ref 8.7–10.2)
Chloride: 102 mmol/L (ref 96–106)
GFR calc Af Amer: 123 mL/min/{1.73_m2} (ref >59)
GFR calc non Af Amer: 107 mL/min/{1.73_m2} (ref >59)
GLOBULIN, TOTAL: 2.3 g/dL (ref 1.5–4.5)
Glucose: 101 mg/dL — ABNORMAL HIGH (ref 65–99)
POTASSIUM: 4.2 mmol/L (ref 3.5–5.2)
SODIUM: 140 mmol/L (ref 134–144)
Total Protein: 6.6 g/dL (ref 6.0–8.5)

## 2017-08-20 LAB — CBC WITH DIFFERENTIAL/PLATELET
BASOS ABS: 0 10*3/uL (ref 0.0–0.2)
Basos: 0 %
EOS (ABSOLUTE): 0.1 10*3/uL (ref 0.0–0.4)
Eos: 1 %
HEMATOCRIT: 43.4 % (ref 37.5–51.0)
Hemoglobin: 15.2 g/dL (ref 13.0–17.7)
Immature Grans (Abs): 0 10*3/uL (ref 0.0–0.1)
Immature Granulocytes: 0 %
LYMPHS ABS: 1.5 10*3/uL (ref 0.7–3.1)
Lymphs: 32 %
MCH: 30.3 pg (ref 26.6–33.0)
MCHC: 35 g/dL (ref 31.5–35.7)
MCV: 87 fL (ref 79–97)
MONOS ABS: 0.5 10*3/uL (ref 0.1–0.9)
Monocytes: 11 %
NEUTROS PCT: 56 %
Neutrophils Absolute: 2.5 10*3/uL (ref 1.4–7.0)
Platelets: 280 10*3/uL (ref 150–450)
RBC: 5.02 x10E6/uL (ref 4.14–5.80)
RDW: 12.9 % (ref 12.3–15.4)
WBC: 4.6 10*3/uL (ref 3.4–10.8)

## 2017-08-20 LAB — TESTOSTERONE,FREE AND TOTAL
Testosterone, Free: 8.7 pg/mL (ref 8.7–25.1)
Testosterone: 431 ng/dL (ref 264–916)

## 2017-08-24 ENCOUNTER — Encounter: Payer: Self-pay | Admitting: Family Medicine

## 2017-10-07 ENCOUNTER — Ambulatory Visit: Payer: 59 | Admitting: Diagnostic Neuroimaging

## 2017-10-29 ENCOUNTER — Ambulatory Visit: Payer: 59 | Admitting: Family Medicine

## 2018-02-17 ENCOUNTER — Ambulatory Visit: Payer: 59 | Admitting: Family Medicine

## 2018-02-17 ENCOUNTER — Encounter: Payer: Self-pay | Admitting: Family Medicine

## 2018-02-17 VITALS — BP 132/88 | HR 95 | Temp 98.8°F | Ht 71.0 in | Wt 197.6 lb

## 2018-02-17 DIAGNOSIS — G43009 Migraine without aura, not intractable, without status migrainosus: Secondary | ICD-10-CM

## 2018-02-17 DIAGNOSIS — J0140 Acute pansinusitis, unspecified: Secondary | ICD-10-CM | POA: Diagnosis not present

## 2018-02-17 MED ORDER — BETAMETHASONE SOD PHOS & ACET 6 (3-3) MG/ML IJ SUSP
6.0000 mg | Freq: Once | INTRAMUSCULAR | Status: AC
Start: 2018-02-17 — End: 2018-02-17
  Administered 2018-02-17: 6 mg via INTRAMUSCULAR

## 2018-02-17 MED ORDER — PSEUDOEPHEDRINE-GUAIFENESIN ER 120-1200 MG PO TB12: 1.0000 | ORAL_TABLET | Freq: Two times a day (BID) | ORAL | 0 refills | Status: DC

## 2018-02-17 MED ORDER — AMOXICILLIN-POT CLAVULANATE 875-125 MG PO TABS: 1.0000 | ORAL_TABLET | Freq: Two times a day (BID) | ORAL | 0 refills | Status: DC

## 2018-02-17 MED ORDER — BETAMETHASONE SOD PHOS & ACET 6 (3-3) MG/ML IJ SUSP: 6.0000 mg | Freq: Once | INTRAMUSCULAR | Status: AC

## 2018-02-17 NOTE — Progress Notes (Signed)
Subjective:  Patient ID: Kevin Hughes, male    DOB: [birth year]  Age: 32 y.o. MRN: [id number]  CC: sinus pressure (x 2 days); Headache; and Nasal Congestion   HPI Kevin Hughes presents for Symptoms include congestion, facial pain, nasal congestion, non productive cough, post nasal drip and sinus pressure. This is triggering migraines that are relieved for a few hours only by triptan. There is no fever, chills, or sweats. Onset of symptoms was a few days ago, gradually worsening since that time.    Depression screen [facility] 2/9 02/17/2018 05/15/2017 05/13/2017  Decreased Interest 0 0 0  Down, Depressed, Hopeless 0 0 0  PHQ - 2 Score 0 0 0    History Kevin Hughes has a past medical history of Stroke ([facility]).   He has a past surgical history that includes Wisdom tooth extraction and Finger surgery.   His family history includes Diabetes in his paternal grandfather; Hypertension in his mother; Throat cancer in his paternal grandfather.He reports that he has never smoked. He has never used smokeless tobacco. He reports that he does not drink alcohol or use drugs.    ROS Review of Systems  Objective:  BP 132/88   Pulse 95   Temp 98.8 F (37.1 C) (Oral)   Ht 5\' 11"  (1.803 m)   Wt 197 lb 9.6 oz (89.6 kg)   BMI 27.56 kg/m   BP Readings from Last 3 Encounters:  02/17/18 132/88  08/19/17 128/87  05/15/17 135/86    Wt Readings from Last 3 Encounters:  02/17/18 197 lb 9.6 oz (89.6 kg)  08/19/17 198 lb 4 oz (89.9 kg)  05/15/17 200 lb 6.4 oz (90.9 kg)     Physical Exam    Assessment & Plan:   Kevin Hughes was seen today for sinus pressure, headache and nasal congestion.  Diagnoses and all orders for this visit:  Acute pansinusitis, recurrence not specified -     betamethasone acetate-betamethasone sodium phosphate (CELESTONE) injection 6 mg  Migraine without aura and without status migrainosus, not intractable -     betamethasone acetate-betamethasone sodium phosphate (CELESTONE)  injection 6 mg -     betamethasone acetate-betamethasone sodium phosphate (CELESTONE) injection 6 mg  Other orders -     amoxicillin-clavulanate (AUGMENTIN) 875-125 MG tablet; Take 1 tablet by mouth 2 (two) times daily. Take all of this medication -     Pseudoephedrine-Guaifenesin [id number] MG TB12; Take 1 tablet by mouth 2 (two) times daily. For congestion       I am having Kevin Hughes start on amoxicillin-clavulanate and Pseudoephedrine-Guaifenesin. I am also having him maintain his cetirizine, fluticasone, rizatriptan, cyclobenzaprine, and metroNIDAZOLE. We administered betamethasone acetate-betamethasone sodium phosphate.  Allergies as of 02/17/2018   No Known Allergies     Medication List        Accurate as of 02/17/18 12:17 PM. Always use your most recent med list.          amoxicillin-clavulanate 875-125 MG tablet Commonly known as:  AUGMENTIN Take 1 tablet by mouth 2 (two) times daily. Take all of this medication   cetirizine 10 MG tablet Commonly known as:  ZYRTEC Take 1 tablet (10 mg total) by mouth daily. For allergy and headache prevention   cyclobenzaprine 10 MG tablet Commonly known as:  FLEXERIL Take 1 tablet (10 mg total) by mouth 3 (three) times daily as needed for muscle spasms.   fluticasone 50 MCG/ACT nasal spray Commonly known as:  FLONASE Place 2 sprays into both nostrils daily.   metroNIDAZOLE  1 % gel Commonly known as:  METROGEL Apply topically daily. For roscea   Pseudoephedrine-Guaifenesin [phone] MG Tb12 Take 1 tablet by mouth 2 (two) times daily. For congestion   rizatriptan 10 MG disintegrating tablet Commonly known as:  MAXALT-MLT Take 1 tablet (10 mg total) by mouth as needed for migraine. May repeat in 2 hours if needed        Follow-up: Return if symptoms worsen or fail to improve.  Kevin Hughes, M.D.

## 2018-02-18 ENCOUNTER — Other Ambulatory Visit: Payer: Self-pay | Admitting: Family Medicine

## 2018-02-18 ENCOUNTER — Telehealth: Payer: Self-pay | Admitting: Family Medicine

## 2018-02-18 ENCOUNTER — Encounter: Payer: Self-pay | Admitting: Family Medicine

## 2018-02-18 MED ORDER — MOXIFLOXACIN HCL 400 MG PO TABS: 400.0000 mg | ORAL_TABLET | Freq: Every day | ORAL | 0 refills | Status: DC

## 2018-02-18 MED ORDER — RIZATRIPTAN BENZOATE 10 MG PO TBDP: 10.0000 mg | ORAL_TABLET | ORAL | 6 refills | Status: DC | PRN

## 2018-02-18 NOTE — Telephone Encounter (Signed)
Left detailed message stating we are waiting for Dr Darlyn ReadStacks to get back to Kevin Hughes about changing the antibiotic. Pt has MyChart message in, will close this encounter.

## 2018-03-10 ENCOUNTER — Telehealth: Payer: 59 | Admitting: Physician Assistant

## 2018-03-10 DIAGNOSIS — R69 Illness, unspecified: Secondary | ICD-10-CM

## 2018-03-10 DIAGNOSIS — J111 Influenza due to unidentified influenza virus with other respiratory manifestations: Secondary | ICD-10-CM

## 2018-03-10 MED ORDER — OSELTAMIVIR PHOSPHATE 75 MG PO CAPS: 75.0000 mg | ORAL_CAPSULE | Freq: Two times a day (BID) | ORAL | 0 refills | Status: DC

## 2018-03-10 NOTE — Progress Notes (Signed)
E visit for Flu like symptoms   We are sorry that you are not feeling well.  Here is how we plan to help! Based on what you have shared with me it looks like you may have a respiratory virus that may be influenza.  Influenza or "the flu" is   an infection caused by a respiratory virus. The flu virus is highly contagious and persons who did not receive their yearly flu vaccination may "catch" the flu from close contact.  We have anti-viral medications to treat the viruses that cause this infection. They are not a "cure" and only shorten the course of the infection. These prescriptions are most effective when they are given within the first 2 days of "flu" symptoms. Antiviral medication are indicated if you have a high risk of complications from the flu. You should also consider an antiviral medication if you are in close contact with someone who is at risk. These medications can help patients avoid complications from the flu  but have side effects that you should know. Possible side effects from Tamiflu or oseltamivir include nausea, vomiting, diarrhea, dizziness, headaches, eye redness, sleep problems or other respiratory symptoms. You should not take Tamiflu if you have an allergy to oseltamivir or any to the ingredients in Tamiflu.  Based upon your symptoms and potential risk factors I have prescribed Oseltamivir (Tamiflu).  It has been sent to your designated pharmacy.  You will take one 75 mg capsule orally twice a day for the next 5 days.  ANYONE WHO HAS FLU SYMPTOMS SHOULD: Stay home. The flu is highly contagious and going out or to work exposes others!  Be sure to drink plenty of fluids. Water is fine as well as fruit juices, sodas and electrolyte beverages. You may want to stay away from caffeine or alcohol. If you are nauseated, try taking small sips of liquids. How do you know if you are getting enough fluid? Your urine should be a pale yellow or almost colorless. Get rest. Taking a steamy  shower or using a humidifier may help nasal congestion and ease sore throat pain. Using a saline nasal spray works much the same way. Cough drops, hard candies and sore throat lozenges may ease your cough. Line up a caregiver. Have someone check on you regularly.   GET HELP RIGHT AWAY IF: You cannot keep down liquids or your medications. You become short of breath Your fell like you are going to pass out or loose consciousness. Your symptoms persist after you have completed your treatment plan MAKE SURE YOU  Understand these instructions. Will watch your condition. Will get help right away if you are not doing well or get worse.  Your e-visit answers were reviewed by a board certified advanced clinical practitioner to complete your personal care plan.  Depending on the condition, your plan could have included both over the counter or prescription medications.  If there is a problem please reply once you have received a response from your provider.  Your safety is important to [place].  If you have drug allergies check your prescription carefully.    You can use MyChart to ask questions about today's visit, request a non-urgent call back, or ask for a work or school excuse for 24 hours related to this e-Visit. If it has been greater than 24 hours you will need to follow up with your provider, or enter a new e-Visit to address those concerns.  You will get an e-mail in the next two  days asking about your experience.  I hope that your e-visit has been valuable and will speed your recovery. Thank you for using e-visits.   ===View-only below this line===   ----- Message -----    From: Madelin HeadingsMichael Mckone    Sent: 03/10/2018  1:06 PM EST      To: E-Visit Mailing List Subject: E-Visit Submission: Flu Like Symptoms  E-Visit Submission: Flu Like Symptoms --------------------------------  Question: How long have you had flu like symptoms? Answer:   less than 48 hours  Question: Do you have a cough  or sore throat? Answer:   I have both a cough and a sore throat  Question: Are you in close contact with anyone who has similar symptoms ? Answer:   No  Question: Do you have a fever? Answer:   Yes, I have a high fever (101 degree or higher)  Question: Are you short of breath? Answer:   No  Question: Do you have constant pain in your chest or abdomen? Answer:   No  Question: Are you able to keep liquids down? Answer:   No  Question: Have you experienced a seizure or loss of consciousness? Answer:   No  Question: Do you have a headache? Answer:   Yes  Question: Is there a rash? Answer:   No  Question: Are you over the age of 34? Answer:   No  Question: Are you treated for any of the following conditions: Asthma, COPD, diabetes, renal failure on dialysis, AIDS, any neuromuscular disease that effects the clearing of secretions heart failure or heart disease? Answer:   No  Question: Describe your sore throat: Answer:   Pain when i swollow feels tight around neck  Question: How long have you had a sore throat? Answer:   1 day  Question: Do you have any tenderness or swelling in your neck? Answer:   Yes  Question: Have you received recent chemotherapy or are you taking a drug that may reduce your ability to fight infections for psoriasis, arthritis, or any other autoimmune condition? Answer:   No  Question: Do you have close contact with anyone who has been diagnosed with any of the conditions listed above? Answer:   No  Question: Are your symptoms severe enough that you think or someone has told you that you need to see a physician urgently? Answer:   No  Question: Are you allergic to Zanamivir or Oseltamivir (Tamiflu)? Answer:   No  Question: Are there children in your family or household that are under the age of 5? Answer:   No  Question: Please list your medication allergies that you may have ? (If 'none' , please list as 'none') Answer:   No  Question:  Please list any additional comments  Answer:   Ive been throwing up since last night body aches joints hurt. Diarrhea. Tired it hit me last night.

## 2018-04-07 ENCOUNTER — Telehealth: Payer: 59 | Admitting: Nurse Practitioner

## 2018-04-07 DIAGNOSIS — J0100 Acute maxillary sinusitis, unspecified: Secondary | ICD-10-CM | POA: Diagnosis not present

## 2018-04-07 MED ORDER — AMOXICILLIN-POT CLAVULANATE 875-125 MG PO TABS: 1.0000 | ORAL_TABLET | Freq: Two times a day (BID) | ORAL | 0 refills | Status: DC

## 2018-04-07 NOTE — Progress Notes (Signed)

## 2018-04-14 ENCOUNTER — Ambulatory Visit: Payer: 59 | Admitting: Family

## 2018-04-14 ENCOUNTER — Encounter: Payer: Self-pay | Admitting: Family

## 2018-04-14 VITALS — BP 123/88 | HR 66 | Temp 96.8°F | Ht 71.0 in | Wt 193.0 lb

## 2018-04-14 DIAGNOSIS — J0111 Acute recurrent frontal sinusitis: Secondary | ICD-10-CM

## 2018-04-14 MED ORDER — DOXYCYCLINE HYCLATE 100 MG PO TABS: 100.0000 mg | ORAL_TABLET | Freq: Two times a day (BID) | ORAL | 0 refills | Status: DC

## 2018-04-14 NOTE — Progress Notes (Signed)
Subjective:    Patient ID: Kevin Hughes, male    DOB: Jul 07, 1985, 33 y.o.   MRN: [id number]  Chief Complaint  Patient presents with  . Headache    pressure and pain,disoriented when bending over    Headache   Associated symptoms include sinus pressure and a sore throat (at first, but improved). Pertinent negatives include no coughing or ear pain.  Sinusitis  This is a recurrent problem. The current episode started 1 to 4 weeks ago. The problem has been waxing and waning since onset. His pain is at a severity of 4/10. The pain is moderate. Associated symptoms include congestion, headaches, sinus pressure and a sore throat (at first, but improved). Pertinent negatives include no chills, coughing or ear pain. Past treatments include oral decongestants and antibiotics (start Augmentin 04/07/18). The treatment provided mild relief.      Review of Systems  Constitutional: Negative for chills.  HENT: Positive for congestion, sinus pressure and sore throat (at first, but improved). Negative for ear pain.   Respiratory: Negative for cough.   Neurological: Positive for headaches.  All other systems reviewed and are negative.      Objective:   Physical Exam Vitals signs reviewed.  Constitutional:      General: He is not in acute distress.    Appearance: He is well-developed.  HENT:     Head: Normocephalic.     Right Ear: External ear normal.     Left Ear: External ear normal.     Nose: Mucosal edema present.     Right Sinus: Frontal sinus tenderness present.     Left Sinus: Frontal sinus tenderness present.  Eyes:     General:        Right eye: No discharge.        Left eye: No discharge.     Pupils: Pupils are equal, round, and reactive to light.  Neck:     Musculoskeletal: Normal range of motion and neck supple.     Thyroid: No thyromegaly.  Cardiovascular:     Rate and Rhythm: Normal rate and regular rhythm.     Heart sounds: Normal heart sounds. No murmur.  Pulmonary:    Effort: Pulmonary effort is normal. No respiratory distress.     Breath sounds: Normal breath sounds. No wheezing.  Abdominal:     General: Bowel sounds are normal. There is no distension.     Palpations: Abdomen is soft.     Tenderness: There is no abdominal tenderness.  Musculoskeletal: Normal range of motion.        General: No tenderness.  Skin:    General: Skin is warm and dry.     Findings: No erythema or rash.  Neurological:     Mental Status: He is alert and oriented to person, place, and time.     Cranial Nerves: No cranial nerve deficit.     Deep Tendon Reflexes: Reflexes are normal and symmetric.  Psychiatric:        Behavior: Behavior normal.        Thought Content: Thought content normal.        Judgment: Judgment normal.       BP 123/88   Pulse 66   Temp (!) 96.8 F (36 C) (Oral)   Ht 5\' 11"  (1.803 m)   Wt 193 lb (87.5 kg)   BMI 26.92 kg/m      Assessment & Plan:  Kevin Hughes comes in today with chief complaint of Headache (pressure and pain,disoriented  when bending over)   Diagnosis and orders addressed:  1. Acute recurrent frontal sinusitis Will stop Augmentin and start Doxycyline  Recommend he stop Zyrtec and start Allerga  If sinus infections continue he should follow up with ENT or Allergen  - Take meds as prescribed - Use a cool mist humidifier  -Use saline nose sprays frequently -Force fluids -For any cough or congestion  Use plain Mucinex- regular strength or max strength is fine -For fever or aces or pains- take tylenol or ibuprofen. -RTO if symptoms worsen or do not improve  - doxycycline (VIBRA-TABS) 100 MG tablet; Take 1 tablet (100 mg total) by mouth 2 (two) times daily.  Dispense: 20 tablet; Refill: 0  Jannifer Rodney, FNP

## 2018-04-14 NOTE — Patient Instructions (Signed)
Sinusitis, Adult  Sinusitis is inflammation of your sinuses. Sinuses are hollow spaces in the bones around your face. Your sinuses are located:   Around your eyes.   In the middle of your forehead.   Behind your nose.   In your cheekbones.  Mucus normally drains out of your sinuses. When your nasal tissues become inflamed or swollen, mucus can become trapped or blocked. This allows bacteria, viruses, and fungi to grow, which leads to infection. Most infections of the sinuses are caused by a virus.  Sinusitis can develop quickly. It can last for up to 4 weeks (acute) or for more than 12 weeks (chronic). Sinusitis often develops after a cold.  What are the causes?  This condition is caused by anything that creates swelling in the sinuses or stops mucus from draining. This includes:   Allergies.   Asthma.   Infection from bacteria or viruses.   Deformities or blockages in your nose or sinuses.   Abnormal growths in the nose (nasal polyps).   Pollutants, such as chemicals or irritants in the air.   Infection from fungi (rare).  What increases the risk?  You are more likely to develop this condition if you:   Have a weak body defense system (immune system).   Do a lot of swimming or diving.   Overuse nasal sprays.   Smoke.  What are the signs or symptoms?  The main symptoms of this condition are pain and a feeling of pressure around the affected sinuses. Other symptoms include:   Stuffy nose or congestion.   Thick drainage from your nose.   Swelling and warmth over the affected sinuses.   Headache.   Upper toothache.   A cough that may get worse at night.   Extra mucus that collects in the throat or the back of the nose (postnasal drip).   Decreased sense of smell and taste.   Fatigue.   A fever.   Sore throat.   Bad breath.  How is this diagnosed?  This condition is diagnosed based on:   Your symptoms.   Your medical history.   A physical exam.   Tests to find out if your condition is  acute or chronic. This may include:  ? Checking your nose for nasal polyps.  ? Viewing your sinuses using a device that has a light (endoscope).  ? Testing for allergies or bacteria.  ? Imaging tests, such as an MRI or CT scan.  In rare cases, a bone biopsy may be done to rule out more serious types of fungal sinus disease.  How is this treated?  Treatment for sinusitis depends on the cause and whether your condition is chronic or acute.   If caused by a virus, your symptoms should go away on their own within 10 days. You may be given medicines to relieve symptoms. They include:  ? Medicines that shrink swollen nasal passages (topical intranasal decongestants).  ? Medicines that treat allergies (antihistamines).  ? A spray that eases inflammation of the nostrils (topical intranasal corticosteroids).  ? Rinses that help get rid of thick mucus in your nose (nasal saline washes).   If caused by bacteria, your health care provider may recommend waiting to see if your symptoms improve. Most bacterial infections will get better without antibiotic medicine. You may be given antibiotics if you have:  ? A severe infection.  ? A weak immune system.   If caused by narrow nasal passages or nasal polyps, you may need   to have surgery.  Follow these instructions at home:  Medicines   Take, use, or apply over-the-counter and prescription medicines only as told by your health care provider. These may include nasal sprays.   If you were prescribed an antibiotic medicine, take it as told by your health care provider. Do not stop taking the antibiotic even if you start to feel better.  Hydrate and humidify     Drink enough fluid to keep your urine pale yellow. Staying hydrated will help to thin your mucus.   Use a cool mist humidifier to keep the humidity level in your home above 50%.   Inhale steam for 10-15 minutes, 3-4 times a day, or as told by your health care provider. You can do this in the bathroom while a hot shower is  running.   Limit your exposure to cool or dry air.  Rest   Rest as much as possible.   Sleep with your head raised (elevated).   Make sure you get enough sleep each night.  General instructions     Apply a warm, moist washcloth to your face 3-4 times a day or as told by your health care provider. This will help with discomfort.   Wash your hands often with soap and water to reduce your exposure to germs. If soap and water are not available, use hand sanitizer.   Do not smoke. Avoid being around people who are smoking (secondhand smoke).   Keep all follow-up visits as told by your health care provider. This is important.  Contact a health care provider if:   You have a fever.   Your symptoms get worse.   Your symptoms do not improve within 10 days.  Get help right away if:   You have a severe headache.   You have persistent vomiting.   You have severe pain or swelling around your face or eyes.   You have vision problems.   You develop confusion.   Your neck is stiff.   You have trouble breathing.  Summary   Sinusitis is soreness and inflammation of your sinuses. Sinuses are hollow spaces in the bones around your face.   This condition is caused by nasal tissues that become inflamed or swollen. The swelling traps or blocks the flow of mucus. This allows bacteria, viruses, and fungi to grow, which leads to infection.   If you were prescribed an antibiotic medicine, take it as told by your health care provider. Do not stop taking the antibiotic even if you start to feel better.   Keep all follow-up visits as told by your health care provider. This is important.  This information is not intended to replace advice given to you by your health care provider. Make sure you discuss any questions you have with your health care provider.  Document Released: 03/04/2005 Document Revised: 08/04/2017 Document Reviewed: 08/04/2017  Elsevier Interactive Patient Education  2019 [employer]

## 2018-05-01 ENCOUNTER — Ambulatory Visit: Payer: 59 | Admitting: Physician Assistant

## 2018-05-01 ENCOUNTER — Encounter: Payer: Self-pay | Admitting: Physician Assistant

## 2018-05-01 VITALS — BP 124/78 | HR 84 | Temp 98.6°F | Ht 71.0 in | Wt 195.2 lb

## 2018-05-01 DIAGNOSIS — L0291 Cutaneous abscess, unspecified: Secondary | ICD-10-CM

## 2018-05-01 DIAGNOSIS — L739 Follicular disorder, unspecified: Secondary | ICD-10-CM

## 2018-05-01 MED ORDER — CLINDAMYCIN HCL 300 MG PO CAPS: 300.0000 mg | ORAL_CAPSULE | Freq: Three times a day (TID) | ORAL | 1 refills | Status: DC

## 2018-05-03 DIAGNOSIS — L739 Follicular disorder, unspecified: Secondary | ICD-10-CM | POA: Insufficient documentation

## 2018-05-03 NOTE — Progress Notes (Signed)
BP 124/78   Pulse 84   Temp 98.6 F (37 C) (Oral)   Ht _0  (1.803 m)   Wt 195 lb 3.2 oz (88.5 kg)   BMI 27.22 kg/m    Subjective:    Patient ID: Kevin Hughes, male    DOB: December 08, 1985, 33 y.o.   MRN: [id number]  HPI: Kevin Hughes is a 33 y.o. male presenting on 05/01/2018 for Recurrent Skin Infections  He has had small boils in the past that he had heal normally.  He said this one came up a few days ago and it is very painful. Tried to pop it put it just got harder. Denies fever or chills  Past Medical History:  Diagnosis Date  . Stroke [facility])    Relevant past medical, surgical, family and social history reviewed and updated as indicated. Interim medical history since our last visit reviewed. Allergies and medications reviewed and updated. DATA REVIEWED: CHART IN EPIC  Family History reviewed for pertinent findings.  Review of Systems  Constitutional: Negative.  Negative for appetite change and fatigue.  HENT: Negative.   Eyes: Negative.  Negative for pain and visual disturbance.  Respiratory: Negative.  Negative for cough, chest tightness, shortness of breath and wheezing.   Cardiovascular: Negative.  Negative for chest pain, palpitations and leg swelling.  Gastrointestinal: Negative.  Negative for abdominal pain, diarrhea, nausea and vomiting.  Endocrine: Negative.   Genitourinary: Negative.   Musculoskeletal: Negative.   Skin: Positive for color change and wound. Negative for rash.  Neurological: Negative.  Negative for weakness, numbness and headaches.  Psychiatric/Behavioral: Negative.     Allergies as of 05/01/2018   No Known Allergies     Medication List       Accurate as of May 01, 2018 11:59 PM. Always use your most recent med list.        cetirizine 10 MG tablet Commonly known as:  ZYRTEC Take 1 tablet (10 mg total) by mouth daily. For allergy and headache prevention   clindamycin 300 MG capsule Commonly known as:  CLEOCIN Take 1 capsule (300  mg total) by mouth 3 (three) times daily.   cyclobenzaprine 10 MG tablet Commonly known as:  FLEXERIL Take 1 tablet (10 mg total) by mouth 3 (three) times daily as needed for muscle spasms.   fluticasone 50 MCG/ACT nasal spray Commonly known as:  FLONASE Place 2 sprays into both nostrils daily.   metroNIDAZOLE 1 % gel Commonly known as:  METROGEL Apply topically daily. For roscea   Pseudoephedrine-Guaifenesin [phone] MG Tb12 Take 1 tablet by mouth 2 (two) times daily. For congestion   rizatriptan 10 MG disintegrating tablet Commonly known as:  MAXALT-MLT Take 1 tablet (10 mg total) by mouth as needed for migraine. May repeat in 2 hours if needed          Objective:    BP 124/78   Pulse 84   Temp 98.6 F (37 C) (Oral)   Ht _1  (1.803 m)   Wt 195 lb 3.2 oz (88.5 kg)   BMI 27.22 kg/m   No Known Allergies  Wt Readings from Last 3 Encounters:  05/01/18 195 lb 3.2 oz (88.5 kg)  04/14/18 193 lb (87.5 kg)  02/17/18 197 lb 9.6 oz (89.6 kg)    Physical Exam Vitals signs and nursing note reviewed.  Constitutional:      General: He is not in acute distress.    Appearance: He is well-developed.  HENT:     Head:  Normocephalic and atraumatic.  Eyes:     Conjunctiva/sclera: Conjunctivae normal.     Pupils: Pupils are equal, round, and reactive to light.  Cardiovascular:     Rate and Rhythm: Normal rate and regular rhythm.     Heart sounds: Normal heart sounds.  Pulmonary:     Effort: Pulmonary effort is normal. No respiratory distress.     Breath sounds: Normal breath sounds.  Skin:    General: Skin is warm and dry.     Findings: Abscess and erythema present.          Comments: Firm red abscess on right gluteus.   Psychiatric:        Behavior: Behavior normal.     Results for orders placed or performed in visit on 08/19/17  CBC with Differential/Platelet  Result Value Ref Range   WBC 4.6 3.4 - 10.8 x10E3/uL   RBC 5.02 4.14 - 5.80 x10E6/uL   Hemoglobin  15.2 13.0 - 17.7 g/dL   Hematocrit 43.4 37.5 - 51.0 %   MCV 87 79 - 97 fL   MCH 30.3 26.6 - 33.0 pg   MCHC 35.0 31.5 - 35.7 g/dL   RDW 12.9 12.3 - 15.4 %   Platelets 280 150 - 450 x10E3/uL   Neutrophils 56 Not Estab. %   Lymphs 32 Not Estab. %   Monocytes 11 Not Estab. %   Eos 1 Not Estab. %   Basos 0 Not Estab. %   Neutrophils Absolute 2.5 1.4 - 7.0 x10E3/uL   Lymphocytes Absolute 1.5 0.7 - 3.1 x10E3/uL   Monocytes Absolute 0.5 0.1 - 0.9 x10E3/uL   EOS (ABSOLUTE) 0.1 0.0 - 0.4 x10E3/uL   Basophils Absolute 0.0 0.0 - 0.2 x10E3/uL   Immature Granulocytes 0 Not Estab. %   Immature Grans (Abs) 0.0 0.0 - 0.1 x10E3/uL  CMP14+EGFR  Result Value Ref Range   Glucose 101 (H) 65 - 99 mg/dL   BUN 11 6 - 20 mg/dL   Creatinine, Ser 0.93 0.76 - 1.27 mg/dL   GFR calc non Af Amer 107 >59 mL/min/1.73   GFR calc Af Amer 123 >59 mL/min/1.73   BUN/Creatinine Ratio 12 9 - 20   Sodium 140 134 - 144 mmol/L   Potassium 4.2 3.5 - 5.2 mmol/L   Chloride 102 96 - 106 mmol/L   CO2 24 20 - 29 mmol/L   Calcium 9.2 8.7 - 10.2 mg/dL   Total Protein 6.6 6.0 - 8.5 g/dL   Albumin 4.3 3.5 - 5.5 g/dL   Globulin, Total 2.3 1.5 - 4.5 g/dL   Albumin/Globulin Ratio 1.9 1.2 - 2.2   Bilirubin Total 0.4 0.0 - 1.2 mg/dL   Alkaline Phosphatase 78 39 - 117 IU/L   AST 23 0 - 40 IU/L   ALT 37 0 - 44 IU/L  Lipid panel  Result Value Ref Range   Cholesterol, Total 145 100 - 199 mg/dL   Triglycerides 91 0 - 149 mg/dL   HDL 44 >39 mg/dL   VLDL Cholesterol Cal 18 5 - 40 mg/dL   LDL Calculated 83 0 - 99 mg/dL   Chol/HDL Ratio 3.3 0.0 - 5.0 ratio  Testosterone,Free and Total  Result Value Ref Range   Testosterone 431 264 - 916 ng/dL   Testosterone, Free 8.7 8.7 - 25.1 pg/mL  Urinalysis  Result Value Ref Range   Specific Gravity, UA 1.015 1.005 - 1.030   pH, UA 8.5 (H) 5.0 - 7.5   Color, UA Yellow Yellow  Appearance Ur Clear Clear   Leukocytes, UA Negative Negative   Protein, UA Negative Negative/Trace   Glucose,  UA Negative Negative   Ketones, UA Negative Negative   RBC, UA Negative Negative   Bilirubin, UA Negative Negative   Urobilinogen, Ur 0.2 0.2 - 1.0 mg/dL   Nitrite, UA Negative Negative      Assessment & Plan:   1. Abscess - clindamycin (CLEOCIN) 300 MG capsule; Take 1 capsule (300 mg total) by mouth 3 (three) times daily.  Dispense: 30 capsule; Refill: 1  2. Folliculitis - clindamycin (CLEOCIN) 300 MG capsule; Take 1 capsule (300 mg total) by mouth 3 (three) times daily.  Dispense: 30 capsule; Refill: 1   Continue all other maintenance medications as listed above.  Follow up plan: No follow-ups on file.  Educational handout given for wound care  Terald Sleeper PA-C [facility] [address]  [place], [place] [address] [phone]   05/03/2018, 9:31 PM

## 2018-06-29 ENCOUNTER — Other Ambulatory Visit: Payer: Self-pay | Admitting: Diagnostic Neuroimaging

## 2018-06-29 ENCOUNTER — Other Ambulatory Visit: Payer: Self-pay | Admitting: Family

## 2018-07-09 ENCOUNTER — Telehealth: Payer: 59 | Admitting: Nurse Practitioner

## 2018-07-09 DIAGNOSIS — H9201 Otalgia, right ear: Secondary | ICD-10-CM

## 2018-07-09 NOTE — Progress Notes (Signed)
Based on what you shared with me, I feel your condition warrants further evaluation and I recommend that you be seen for a face to face office visit.     NOTE: If you entered your credit card information for this eVisit, you will not be charged. You may see a "hold" on your card for the $35 but that hold will drop off and you will not have a charge processed.  If you are having a true medical emergency please call 911.  If you need an urgent face to face visit, [facility] has four urgent care centers for your convenience.    PLEASE NOTE: THE [facility] LOCATIONS AND URGENT CARE CLINICS DO NOT HAVE THE TESTING FOR CORONAVIRUS COVID19 AVAILABLE.  IF YOU FEEL YOU NEED THIS TEST YOU MUST GO TO A TRIAGE LOCATION AT ONE OF THE HOSPITAL EMERGENCY DEPARTMENTS   [email or web address] to reserve your spot online an avoid wait times  [facility] [address], [address] [place], [place] [address] Modified hours of operation: Monday-Friday, 10 AM to 6 PM  Saturday & Sunday 10 AM to 4 PM *Across the street from [employer]  [facility] (New Address!) [address], [address] [place], [place] [address] *Just off [address], across the road from [employer]* Modified hours of operation: Monday-Friday, 10 AM to 5 PM  Closed Saturday & Sunday   The following sites will take your insurance:  . [facility]  [phone] Get Driving Directions Find a Provider at this Location  [address] [place], [place] [address] . 10 am to 8 pm Monday-Friday . 12 pm to 8 pm Saturday-Sunday   . [facility]  [phone] Get Driving Directions Find a Provider at this Location  [address], [address] [place], [place] [address] . 8 am to 8 pm Monday-Friday . 9 am to 6 pm Saturday . 11 am to 6 pm Sunday   . [facility] [place]  [phone] Get Driving Directions  [address] [place],  [address] . 8 am to 8 pm Monday-Friday . 8 am to 4 pm Saturday-Sunday   Your e-visit answers were reviewed by a board certified advanced clinical practitioner to complete your personal care plan.  Thank you for using e-Visits. 

## 2018-08-20 ENCOUNTER — Other Ambulatory Visit: Payer: Self-pay

## 2018-08-21 ENCOUNTER — Encounter: Payer: Self-pay | Admitting: Family Medicine

## 2018-08-21 ENCOUNTER — Ambulatory Visit (INDEPENDENT_AMBULATORY_CARE_PROVIDER_SITE_OTHER): Payer: 59 | Admitting: Family Medicine

## 2018-08-21 ENCOUNTER — Other Ambulatory Visit: Payer: Self-pay

## 2018-08-21 VITALS — BP 122/79 | HR 65 | Temp 97.3°F | Ht 71.0 in | Wt 197.0 lb

## 2018-08-21 DIAGNOSIS — Z0000 Encounter for general adult medical examination without abnormal findings: Secondary | ICD-10-CM

## 2018-08-21 DIAGNOSIS — Z0001 Encounter for general adult medical examination with abnormal findings: Secondary | ICD-10-CM | POA: Diagnosis not present

## 2018-08-21 DIAGNOSIS — B351 Tinea unguium: Secondary | ICD-10-CM

## 2018-08-21 DIAGNOSIS — L719 Rosacea, unspecified: Secondary | ICD-10-CM | POA: Diagnosis not present

## 2018-08-21 LAB — URINALYSIS
Bilirubin, UA: NEGATIVE
Glucose, UA: NEGATIVE
Ketones, UA: NEGATIVE
Leukocytes,UA: NEGATIVE
Nitrite, UA: NEGATIVE
RBC, UA: NEGATIVE
Specific Gravity, UA: 1.02 (ref 1.005–1.030)
Urobilinogen, Ur: 0.2 mg/dL (ref 0.2–1.0)
pH, UA: 8.5 — ABNORMAL HIGH (ref 5.0–7.5)

## 2018-08-21 MED ORDER — RIZATRIPTAN BENZOATE 10 MG PO TBDP: 10.0000 mg | ORAL_TABLET | ORAL | 6 refills | Status: DC | PRN

## 2018-08-21 MED ORDER — FLUTICASONE PROPIONATE 50 MCG/ACT NA SUSP: NASAL | 1 refills | Status: DC

## 2018-08-22 ENCOUNTER — Encounter: Payer: Self-pay | Admitting: Family Medicine

## 2018-08-22 LAB — CBC WITH DIFFERENTIAL/PLATELET
Basophils Absolute: 0 10*3/uL (ref 0.0–0.2)
Basos: 1 %
EOS (ABSOLUTE): 0.1 10*3/uL (ref 0.0–0.4)
Eos: 2 %
Hematocrit: 42.6 % (ref 37.5–51.0)
Hemoglobin: 15 g/dL (ref 13.0–17.7)
Immature Grans (Abs): 0 10*3/uL (ref 0.0–0.1)
Immature Granulocytes: 0 %
Lymphocytes Absolute: 1.6 10*3/uL (ref 0.7–3.1)
Lymphs: 32 %
MCH: 30.4 pg (ref 26.6–33.0)
MCHC: 35.2 g/dL (ref 31.5–35.7)
MCV: 86 fL (ref 79–97)
Monocytes Absolute: 0.5 10*3/uL (ref 0.1–0.9)
Monocytes: 11 %
Neutrophils Absolute: 2.7 10*3/uL (ref 1.4–7.0)
Neutrophils: 54 %
Platelets: 295 10*3/uL (ref 150–450)
RBC: 4.94 x10E6/uL (ref 4.14–5.80)
RDW: 11.7 % (ref 11.6–15.4)
WBC: 5 10*3/uL (ref 3.4–10.8)

## 2018-08-22 LAB — LIPID PANEL
Chol/HDL Ratio: 3.1 ratio (ref 0.0–5.0)
Cholesterol, Total: 165 mg/dL (ref 100–199)
HDL: 53 mg/dL (ref >39)
LDL Calculated: 96 mg/dL (ref 0–99)
Triglycerides: 80 mg/dL (ref 0–149)
VLDL Cholesterol Cal: 16 mg/dL (ref 5–40)

## 2018-08-22 LAB — CMP14+EGFR
ALT: 36 IU/L (ref 0–44)
AST: 28 IU/L (ref 0–40)
Albumin/Globulin Ratio: 1.5 (ref 1.2–2.2)
Albumin: 4.3 g/dL (ref 4.0–5.0)
Alkaline Phosphatase: 80 IU/L (ref 39–117)
BUN/Creatinine Ratio: 16 (ref 9–20)
BUN: 16 mg/dL (ref 6–20)
Bilirubin Total: 0.5 mg/dL (ref 0.0–1.2)
CO2: 25 mmol/L (ref 20–29)
Calcium: 8.9 mg/dL (ref 8.7–10.2)
Chloride: 101 mmol/L (ref 96–106)
Creatinine, Ser: 0.99 mg/dL (ref 0.76–1.27)
GFR calc Af Amer: 114 mL/min/{1.73_m2} (ref >59)
GFR calc non Af Amer: 98 mL/min/{1.73_m2} (ref >59)
Globulin, Total: 2.8 g/dL (ref 1.5–4.5)
Glucose: 94 mg/dL (ref 65–99)
Potassium: 4.3 mmol/L (ref 3.5–5.2)
Sodium: 141 mmol/L (ref 134–144)
Total Protein: 7.1 g/dL (ref 6.0–8.5)

## 2018-08-22 LAB — VITAMIN D 25 HYDROXY (VIT D DEFICIENCY, FRACTURES): Vit D, 25-Hydroxy: 22.4 ng/mL — ABNORMAL LOW (ref 30.0–100.0)

## 2018-08-22 MED ORDER — DOXYCYCLINE HYCLATE 100 MG PO CAPS: 100.0000 mg | ORAL_CAPSULE | Freq: Two times a day (BID) | ORAL | 0 refills | Status: DC

## 2018-08-22 MED ORDER — TERBINAFINE HCL 250 MG PO TABS: 250.0000 mg | ORAL_TABLET | Freq: Every day | ORAL | 2 refills | Status: DC

## 2018-08-22 MED ORDER — METRONIDAZOLE 1 % EX GEL: Freq: Every day | CUTANEOUS | 11 refills | Status: DC

## 2018-08-22 NOTE — Progress Notes (Addendum)
Subjective:  Patient ID: Kevin Hughes, male    DOB: 07/12/1985  Age: 33 y.o. MRN: [id number]  CC: Annual Exam   HPI Kevin Hughes presents for complete physical exam.   Depression screen [facility] 2/9 08/21/2018 05/01/2018 04/14/2018  Decreased Interest 0 0 0  Down, Depressed, Hopeless 0 0 0  PHQ - 2 Score 0 0 0    History Kevin Hughes has a past medical history of Stroke ([facility]).   Kevin Hughes has a past surgical history that includes Wisdom tooth extraction and Finger surgery.   His family history includes Diabetes in his paternal grandfather; Hypertension in his mother; Throat cancer in his paternal grandfather.Kevin Hughes reports that Kevin Hughes has never smoked. Kevin Hughes has never used smokeless tobacco. Kevin Hughes reports that Kevin Hughes does not drink alcohol or use drugs.    ROS Review of Systems  Constitutional: Negative.  Negative for activity change, fatigue and unexpected weight change.  HENT: Negative.  Negative for congestion, ear pain, hearing loss, postnasal drip and trouble swallowing.   Eyes: Negative for pain and visual disturbance.  Respiratory: Negative for cough, chest tightness and shortness of breath.   Cardiovascular: Negative for chest pain, palpitations and leg swelling.  Gastrointestinal: Negative for abdominal distention, abdominal pain, blood in stool, constipation, diarrhea, nausea and vomiting.  Endocrine: Negative for cold intolerance, heat intolerance and polydipsia.  Genitourinary: Negative for difficulty urinating, dysuria, flank pain, frequency and urgency.  Musculoskeletal: Negative for arthralgias, joint swelling and myalgias.  Skin: Positive for rash (red rash on face. Great toenails misshapen). Negative for color change and wound.  Neurological: Negative for dizziness, syncope, speech difficulty, weakness, light-headedness, numbness and headaches.  Hematological: Does not bruise/bleed easily.  Psychiatric/Behavioral: Negative for confusion, decreased concentration, dysphoric mood and sleep disturbance.  The patient is not nervous/anxious.     Objective:  BP 122/79   Pulse 65   Temp (!) 97.3 F (36.3 C) (Oral)   Ht '5\' 11"'  (1.803 m)   Wt 197 lb (89.4 kg)   BMI 27.48 kg/m   BP Readings from Last 3 Encounters:  08/21/18 122/79  05/01/18 124/78  04/14/18 123/88    Wt Readings from Last 3 Encounters:  08/21/18 197 lb (89.4 kg)  05/01/18 195 lb 3.2 oz (88.5 kg)  04/14/18 193 lb (87.5 kg)     Physical Exam Constitutional:      General: Kevin Hughes is not in acute distress.    Appearance: Kevin Hughes is well-developed.  HENT:     Head: Normocephalic and atraumatic.     Right Ear: External ear normal.     Left Ear: External ear normal.     Nose: Nose normal.  Eyes:     Conjunctiva/sclera: Conjunctivae normal.     Pupils: Pupils are equal, round, and reactive to light.  Neck:     Musculoskeletal: Normal range of motion and neck supple.     Thyroid: No thyromegaly.     Trachea: No tracheal deviation.  Cardiovascular:     Rate and Rhythm: Normal rate and regular rhythm.     Heart sounds: Normal heart sounds. No murmur. No friction rub. No gallop.   Pulmonary:     Effort: Pulmonary effort is normal. No respiratory distress.     Breath sounds: Normal breath sounds. No wheezing or rales.  Abdominal:     General: Bowel sounds are normal. There is no distension.     Palpations: Abdomen is soft. There is no mass.     Tenderness: There is no abdominal tenderness.  Hernia: There is no hernia in the right inguinal area or left inguinal area.  Genitourinary:    Penis: Normal.      Scrotum/Testes: Normal.  Musculoskeletal: Normal range of motion.  Lymphadenopathy:     Cervical: No cervical adenopathy.  Skin:    General: Skin is warm and dry.     Findings: Rash (telangectasia and acneiform papules on face. closed comedones .  Great toes have moderate onycholysis) present.  Neurological:     Mental Status: Kevin Hughes is alert and oriented to person, place, and time.     Deep Tendon Reflexes: Reflexes  are normal and symmetric.  Psychiatric:        Behavior: Behavior normal.        Thought Content: Thought content normal.        Judgment: Judgment normal.       Assessment & Plan:   Kevin Hughes was seen today for annual exam.  Diagnoses and all orders for this visit:  Annual physical exam -     CBC with Differential/Platelet -     CMP14+EGFR -     Lipid panel -     VITAMIN D 25 Hydroxy (Vit-D Deficiency, Fractures) -     Urinalysis  Onychomycosis  Rosacea  Other orders -     fluticasone (FLONASE) 50 MCG/ACT nasal spray; SPRAY 2 SPRAYS INTO EACH NOSTRIL EVERY DAY -     rizatriptan (MAXALT-MLT) 10 MG disintegrating tablet; Take 1 tablet (10 mg total) by mouth as needed for migraine. May repeat in 2 hours if needed -     doxycycline (VIBRAMYCIN) 100 MG capsule; Take 1 capsule (100 mg total) by mouth 2 (two) times daily. -     terbinafine (LAMISIL) 250 MG tablet; Take 1 tablet (250 mg total) by mouth daily. -     metroNIDAZOLE (METROGEL) 1 % gel; Apply topically daily. For roscea       I have discontinued Kevin Hughes's Pseudoephedrine-Guaifenesin and clindamycin. I am also having him start on doxycycline and terbinafine. Additionally, I am having him maintain his cetirizine, cyclobenzaprine, fluticasone, rizatriptan, and metroNIDAZOLE.  Allergies as of 08/21/2018   No Known Allergies     Medication List       Accurate as of August 21, 2018 11:59 PM. If you have any questions, ask your nurse or doctor.        STOP taking these medications   clindamycin 300 MG capsule Commonly known as:  Cleocin Stopped by:  Claretta Fraise, MD   Pseudoephedrine-Guaifenesin [phone] MG Tb12 Stopped by:  Claretta Fraise, MD     TAKE these medications   cetirizine 10 MG tablet Commonly known as:  ZYRTEC Take 1 tablet (10 mg total) by mouth daily. For allergy and headache prevention   cyclobenzaprine 10 MG tablet Commonly known as:  FLEXERIL Take 1 tablet (10 mg total) by mouth 3  (three) times daily as needed for muscle spasms.   doxycycline 100 MG capsule Commonly known as:  Vibramycin Take 1 capsule (100 mg total) by mouth 2 (two) times daily. Started by:  Claretta Fraise, MD   fluticasone 50 MCG/ACT nasal spray Commonly known as:  FLONASE SPRAY 2 SPRAYS INTO EACH NOSTRIL EVERY DAY   metroNIDAZOLE 1 % gel Commonly known as:  Metrogel Apply topically daily. For roscea   rizatriptan 10 MG disintegrating tablet Commonly known as:  MAXALT-MLT Take 1 tablet (10 mg total) by mouth as needed for migraine. May repeat in 2 hours if needed   terbinafine  250 MG tablet Commonly known as:  LamISIL Take 1 tablet (250 mg total) by mouth daily. Started by:  Claretta Fraise, MD        Follow-up: Return in about 1 year (around 08/21/2019).  Claretta Fraise, M.D.

## 2018-08-24 ENCOUNTER — Other Ambulatory Visit: Payer: Self-pay | Admitting: *Deleted

## 2018-08-24 MED ORDER — VITAMIN D (ERGOCALCIFEROL) 1.25 MG (50000 UNIT) PO CAPS: 50000.0000 [IU] | ORAL_CAPSULE | ORAL | 1 refills | Status: DC

## 2018-08-25 ENCOUNTER — Encounter: Payer: Self-pay | Admitting: Family Medicine

## 2018-08-25 ENCOUNTER — Other Ambulatory Visit: Payer: Self-pay | Admitting: Family Medicine

## 2018-08-25 MED ORDER — DOXYCYCLINE HYCLATE 100 MG PO CAPS: 100.0000 mg | ORAL_CAPSULE | Freq: Every day | ORAL | 1 refills | Status: DC

## 2018-08-31 MED FILL — FLUTICASONE PROP 50 MCG SPR: 50 | 30 days supply | Qty: 16 | Fill #0

## 2018-08-31 MED FILL — DOXYCYCLINE HYC 100 MG CAPS: 100 | 90 days supply | Qty: 90 | Fill #0

## 2018-09-15 MED FILL — VIT D2 1.25 MG (50,000 UNIT: 1.25 MG | 84 days supply | Qty: 12 | Fill #0

## 2018-09-15 MED FILL — TERBINAFINE HCL 250 MG TAB: 250 | 30 days supply | Qty: 30 | Fill #0

## 2018-09-17 ENCOUNTER — Ambulatory Visit: Payer: 59 | Admitting: Family Medicine

## 2018-09-17 ENCOUNTER — Other Ambulatory Visit: Payer: Self-pay

## 2018-09-17 ENCOUNTER — Encounter: Payer: Self-pay | Admitting: Family Medicine

## 2018-09-17 VITALS — BP 137/93 | HR 76 | Temp 97.5°F | Ht 71.0 in | Wt 198.0 lb

## 2018-09-17 DIAGNOSIS — G43009 Migraine without aura, not intractable, without status migrainosus: Secondary | ICD-10-CM | POA: Diagnosis not present

## 2018-09-17 MED ORDER — KETOROLAC TROMETHAMINE 60 MG/2ML IM SOLN
60.0000 mg | Freq: Once | INTRAMUSCULAR | Status: AC
  Administered 2018-09-17: 60 mg via INTRAMUSCULAR

## 2018-09-17 NOTE — Progress Notes (Signed)
BP (!) 137/93   Pulse 76   Temp (!) 97.5 F (36.4 C) (Oral)   Ht 5\' 11"  (1.803 m)   Wt 198 lb (89.8 kg)   BMI 27.62 kg/m    Subjective:   Patient ID: Kevin Hughes, male    DOB: 06/14/1985, 33 y.o.   MRN: [id number]  HPI: Kevin Hughes is a 33 y.o. male presenting on 09/17/2018 for Migraine (x 3 days)   HPI Migraine headache Patient has been having a migraine headache for the past 3 days.  He has been using his Maxalt and Tylenol and ibuprofen.  He use the 2 doses of Maxalt each day and he feels like it is not been helping.  He describes the headache is bilateral temporal sometimes going to the back of his neck and sometimes going right between the eyes.  This is how he typically gets his migraines although he does not get them very frequently.  Patient has sensitivity to lights but not sounds and denies anything atypical about this migraine except for that is been lasting for 3 days.  He does not get them very often and only gets them a couple times a year.  Relevant past medical, surgical, family and social history reviewed and updated as indicated. Interim medical history since our last visit reviewed. Allergies and medications reviewed and updated.  Review of Systems  Constitutional: Negative for chills and fever.  Eyes: Negative for visual disturbance.  Respiratory: Negative for shortness of breath and wheezing.   Cardiovascular: Negative for chest pain and leg swelling.  Musculoskeletal: Negative for back pain and gait problem.  Skin: Negative for rash.  Neurological: Negative for dizziness, weakness and numbness.  All other systems reviewed and are negative.   Per HPI unless specifically indicated above   Allergies as of 09/17/2018   No Known Allergies     Medication List       Accurate as of September 17, 2018 11:17 AM. If you have any questions, ask your nurse or doctor.        STOP taking these medications   doxycycline 100 MG capsule Commonly known as: Vibramycin  Stopped by: Elige RadonJoshua A Dettinger, MD     TAKE these medications   cetirizine 10 MG tablet Commonly known as: ZYRTEC Take 1 tablet (10 mg total) by mouth daily. For allergy and headache prevention   cyclobenzaprine 10 MG tablet Commonly known as: FLEXERIL Take 1 tablet (10 mg total) by mouth 3 (three) times daily as needed for muscle spasms.   fluticasone 50 MCG/ACT nasal spray Commonly known as: FLONASE SPRAY 2 SPRAYS INTO EACH NOSTRIL EVERY DAY   metroNIDAZOLE 1 % gel Commonly known as: Metrogel Apply topically daily. For roscea   rizatriptan 10 MG disintegrating tablet Commonly known as: MAXALT-MLT Take 1 tablet (10 mg total) by mouth as needed for migraine. May repeat in 2 hours if needed   terbinafine 250 MG tablet Commonly known as: LamISIL Take 1 tablet (250 mg total) by mouth daily.   Vitamin D (Ergocalciferol) 1.25 MG (50000 UT) Caps capsule Commonly known as: DRISDOL Take 1 capsule (50,000 Units total) by mouth every 7 (seven) days.        Objective:   BP (!) 137/93   Pulse 76   Temp (!) 97.5 F (36.4 C) (Oral)   Ht 5\' 11"  (1.803 m)   Wt 198 lb (89.8 kg)   BMI 27.62 kg/m   Wt Readings from Last 3 Encounters:  09/17/18 198 lb (  89.8 kg)  08/21/18 197 lb (89.4 kg)  05/01/18 195 lb 3.2 oz (88.5 kg)    Physical Exam Vitals signs and nursing note reviewed.  Constitutional:      General: He is not in acute distress.    Appearance: He is well-developed. He is not diaphoretic.  Eyes:     General: No scleral icterus.    Extraocular Movements: Extraocular movements intact.     Conjunctiva/sclera: Conjunctivae normal.     Pupils: Pupils are equal, round, and reactive to light.  Neck:     Musculoskeletal: Neck supple.     Thyroid: No thyromegaly.  Cardiovascular:     Rate and Rhythm: Normal rate and regular rhythm.     Heart sounds: Normal heart sounds. No murmur.  Pulmonary:     Effort: Pulmonary effort is normal. No respiratory distress.     Breath  sounds: Normal breath sounds. No wheezing.  Musculoskeletal: Normal range of motion.  Lymphadenopathy:     Cervical: No cervical adenopathy.  Skin:    General: Skin is warm and dry.     Findings: No rash.  Neurological:     Mental Status: He is alert and oriented to person, place, and time.     Cranial Nerves: No cranial nerve deficit.     Sensory: No sensory deficit.     Coordination: Coordination normal.  Psychiatric:        Behavior: Behavior normal.     Assessment & Plan:   Problem List Items Addressed This Visit      Cardiovascular and Mediastinum   Migraine without aura and without status migrainosus, not intractable - Primary      Will give patient Toradol and if does not improve then he can come back.  No alarming symptoms for migraine, if he does have any he will call back. Follow up plan: Return if symptoms worsen or fail to improve.  Counseling provided for all of the vaccine components No orders of the defined types were placed in this encounter.   Caryl Pina, MD [facility] 09/17/2018, 11:17 AM

## 2018-09-21 MED FILL — RIZATRIPTAN 10 MG ODT: 10 | 30 days supply | Qty: 9 | Fill #0

## 2018-09-22 DIAGNOSIS — G44209 Tension-type headache, unspecified, not intractable: Secondary | ICD-10-CM | POA: Diagnosis not present

## 2018-09-24 ENCOUNTER — Encounter: Payer: Self-pay | Admitting: Family Medicine

## 2018-09-24 ENCOUNTER — Ambulatory Visit: Payer: 59 | Admitting: Family Medicine

## 2018-09-24 ENCOUNTER — Other Ambulatory Visit: Payer: Self-pay

## 2018-09-24 VITALS — BP 125/85 | HR 66 | Temp 98.9°F | Ht 71.0 in | Wt 199.8 lb

## 2018-09-24 DIAGNOSIS — M542 Cervicalgia: Secondary | ICD-10-CM | POA: Diagnosis not present

## 2018-09-24 DIAGNOSIS — G43009 Migraine without aura, not intractable, without status migrainosus: Secondary | ICD-10-CM

## 2018-09-24 MED ORDER — FROVATRIPTAN SUCCINATE 2.5 MG PO TABS: 2.5000 mg | ORAL_TABLET | ORAL | 3 refills | Status: DC | PRN

## 2018-09-24 MED ORDER — TIZANIDINE HCL 6 MG PO CAPS: 6.0000 mg | ORAL_CAPSULE | Freq: Three times a day (TID) | ORAL | 1 refills | Status: DC | PRN

## 2018-09-24 NOTE — Progress Notes (Signed)
Subjective:  Patient ID: Kevin Hughes, male    DOB: 27-Oct-1985  Age: 32 y.o. MRN: [id number]  CC: Migraine (went to urgent care Tuesday was given Steroid and torodol shot and steroid dose pack)   HPI Kevin Hughes presents for Pt. Seen at urgent care due to severity of HA two days ago. He describes a tightness that starts at th posterior nape of the neck and radiates to the temples. He describes it as pressure. He has a history of migraine. This is a little different. His Maxalt has not been effective in spite of maximal use. So far the steroids have not helped either. There is some photophobia as well. No known trigger. Onset was about 9 days ago.   Depression screen [facility] 2/9 09/24/2018 08/21/2018 05/01/2018  Decreased Interest 0 0 0  Down, Depressed, Hopeless 0 0 0  PHQ - 2 Score 0 0 0    History Kevin Hughes has a past medical history of Stroke ([place]).   He has a past surgical history that includes Wisdom tooth extraction and Finger surgery.   His family history includes Diabetes in his paternal grandfather; Hypertension in his mother; Throat cancer in his paternal grandfather.He reports that he has never smoked. He has never used smokeless tobacco. He reports that he does not drink alcohol or use drugs.    ROS Review of Systems  Constitutional: Negative for fever.  Eyes: Positive for photophobia.  Respiratory: Negative for shortness of breath.   Cardiovascular: Negative for chest pain.  Musculoskeletal: Negative for arthralgias.  Skin: Negative for rash.  Neurological: Positive for headaches. Negative for dizziness and weakness.  Psychiatric/Behavioral: Positive for decreased concentration. Negative for agitation.    Objective:  BP 125/85    Pulse 66    Temp 98.9 F (37.2 C) (Oral)    Ht 5\' 11"  (1.803 m)    Wt 199 lb 12.8 oz (90.6 kg)    BMI 27.87 kg/m   BP Readings from Last 3 Encounters:  09/24/18 125/85  09/17/18 (!) 137/93  08/21/18 122/79    Wt Readings from Last 3  Encounters:  09/24/18 199 lb 12.8 oz (90.6 kg)  09/17/18 198 lb (89.8 kg)  08/21/18 197 lb (89.4 kg)     Physical Exam Vitals signs reviewed.  Constitutional:      General: He is not in acute distress.    Appearance: He is well-developed.  HENT:     Head: Normocephalic and atraumatic.     Right Ear: Tympanic membrane and external ear normal. No decreased hearing noted.     Left Ear: Tympanic membrane and external ear normal. No decreased hearing noted.     Nose: Nose normal.     Mouth/Throat:     Pharynx: No oropharyngeal exudate or posterior oropharyngeal erythema.  Eyes:     Conjunctiva/sclera: Conjunctivae normal.     Pupils: Pupils are equal, round, and reactive to light.  Neck:     Musculoskeletal: Normal range of motion and neck supple.  Cardiovascular:     Rate and Rhythm: Normal rate and regular rhythm.     Heart sounds: Normal heart sounds. No murmur.  Pulmonary:     Effort: Pulmonary effort is normal. No respiratory distress.     Breath sounds: Normal breath sounds. No wheezing or rales.  Abdominal:     Palpations: Abdomen is soft.     Tenderness: There is no abdominal tenderness.  Musculoskeletal: Normal range of motion.  Skin:    General: Skin is warm  and dry.  Neurological:     Mental Status: He is alert and oriented to person, place, and time.     Deep Tendon Reflexes: Reflexes are normal and symmetric.  Psychiatric:        Behavior: Behavior normal.        Thought Content: Thought content normal.        Judgment: Judgment normal.       Assessment & Plan:   Kevin NeedleMichael was seen today for migraine.  Diagnoses and all orders for this visit:  Migraine without aura and without status migrainosus, not intractable  Cervicalgia  Other orders -     Discontinue: tizanidine (ZANAFLEX) 6 MG capsule; Take 1 capsule (6 mg total) by mouth 3 (three) times daily as needed for muscle spasms. -     Discontinue: frovatriptan (FROVA) 2.5 MG tablet; Take 1 tablet (2.5  mg total) by mouth as needed for migraine. If recurs, may repeat after 2 hours. Max of 3 tabs in 24 hours.       I am having Kevin Hughes maintain his cetirizine, fluticasone, rizatriptan, terbinafine, metroNIDAZOLE, Vitamin D (Ergocalciferol), and predniSONE.  Allergies as of 09/24/2018   No Known Allergies     Medication List       Accurate as of September 24, 2018 11:59 PM. If you have any questions, ask your nurse or doctor.        cetirizine 10 MG tablet Commonly known as: ZYRTEC Take 1 tablet (10 mg total) by mouth daily. For allergy and headache prevention   cyclobenzaprine 10 MG tablet Commonly known as: FLEXERIL Take 1 tablet (10 mg total) by mouth 3 (three) times daily as needed for muscle spasms.   fluticasone 50 MCG/ACT nasal spray Commonly known as: FLONASE SPRAY 2 SPRAYS INTO EACH NOSTRIL EVERY DAY   frovatriptan 2.5 MG tablet Commonly known as: FROVA Take 1 tablet (2.5 mg total) by mouth as needed for migraine. If recurs, may repeat after 2 hours. Max of 3 tabs in 24 hours. Started by: Mechele ClaudeWarren Rekita Miotke, MD   metroNIDAZOLE 1 % gel Commonly known as: Metrogel Apply topically daily. For roscea   predniSONE 5 MG tablet Commonly known as: DELTASONE   rizatriptan 10 MG disintegrating tablet Commonly known as: MAXALT-MLT Take 1 tablet (10 mg total) by mouth as needed for migraine. May repeat in 2 hours if needed   terbinafine 250 MG tablet Commonly known as: LamISIL Take 1 tablet (250 mg total) by mouth daily.   tizanidine 6 MG capsule Commonly known as: ZANAFLEX Take 1 capsule (6 mg total) by mouth 3 (three) times daily as needed for muscle spasms. Started by: Mechele ClaudeWarren Kelvon Giannini, MD   Vitamin D (Ergocalciferol) 1.25 MG (50000 UT) Caps capsule Commonly known as: DRISDOL Take 1 capsule (50,000 Units total) by mouth every 7 (seven) days.        Follow-up: Return if symptoms worsen or fail to improve.  Mechele ClaudeWarren Sendy Pluta, M.D.

## 2018-09-24 NOTE — Patient Instructions (Signed)
Hold maxalt and flexeril while taking new medication.

## 2018-09-29 ENCOUNTER — Other Ambulatory Visit: Payer: Self-pay | Admitting: Family Medicine

## 2018-09-29 MED ORDER — CYCLOBENZAPRINE HCL 10 MG PO TABS: 10.0000 mg | ORAL_TABLET | Freq: Three times a day (TID) | ORAL | 5 refills | Status: DC | PRN

## 2018-10-02 ENCOUNTER — Encounter: Payer: Self-pay | Admitting: Family Medicine

## 2018-10-26 MED FILL — TERBINAFINE HCL 250 MG TAB: 250 | 30 days supply | Qty: 30 | Fill #1

## 2018-11-03 MED FILL — RIZATRIPTAN 10 MG ODT: 10 | 30 days supply | Qty: 9 | Fill #1

## 2018-11-25 MED FILL — DOXYCYCLINE HYC 100 MG CAPS: 100 | 90 days supply | Qty: 90 | Fill #1

## 2018-12-09 ENCOUNTER — Encounter: Payer: Self-pay | Admitting: Family Medicine

## 2018-12-09 ENCOUNTER — Other Ambulatory Visit: Payer: Self-pay

## 2018-12-09 ENCOUNTER — Ambulatory Visit: Payer: 59 | Admitting: Family Medicine

## 2018-12-09 VITALS — BP 122/85 | HR 68 | Temp 96.6°F | Ht 71.0 in | Wt 203.0 lb

## 2018-12-09 DIAGNOSIS — R110 Nausea: Secondary | ICD-10-CM | POA: Diagnosis not present

## 2018-12-09 DIAGNOSIS — R42 Dizziness and giddiness: Secondary | ICD-10-CM

## 2018-12-09 NOTE — Patient Instructions (Signed)
Discontinue doxycycline for two weeks. Let me know if you have any further spells or not.

## 2018-12-09 NOTE — Progress Notes (Signed)
Subjective:  Patient ID: Kevin Hughes, male    DOB: [birth year]  Age: 33 y.o. MRN: [id number]  CC: At times he gets hot   HPI Kevin Hughes presents for patient having spells when he gets up in the morning for the last couple of months.  He takes his medication and shortly after he will start feeling hot clammy and dizzy.  He describes the dizziness as lightheadedness.  It does not happen every morning but it happens roughly half the mornings per week.  He is concerned that his blood sugar may be the cause because he gets some relief by eating candy and waiting 30 minutes.  There is also a positive family history of diabetes.  The doxycycline that he is taking every morning has been in use for about 2 months.  It was prescribed because of rosacea.  He says that is getting much better.  He still has some acne breaking out on his back.  Depression screen [facility] 2/9 12/09/2018 09/24/2018 08/21/2018  Decreased Interest 0 0 0  Down, Depressed, Hopeless 0 0 0  PHQ - 2 Score 0 0 0    History Kevin Hughes has a past medical history of Stroke ([place]).   He has a past surgical history that includes Wisdom tooth extraction and Finger surgery.   His family history includes Diabetes in his paternal grandfather; Hypertension in his mother; Throat cancer in his paternal grandfather.He reports that he has never smoked. He has never used smokeless tobacco. He reports that he does not drink alcohol or use drugs.    ROS Review of Systems  Constitutional: Negative for fever.  Respiratory: Negative for shortness of breath.   Cardiovascular: Negative for chest pain.  Musculoskeletal: Negative for arthralgias.  Skin: Positive for rash (Mild acne on the upper back.  Minimal on the chest.  Near resolution of the rosacea on the face.).    Objective:  BP 122/85   Pulse 68   Temp (!) 96.6 F (35.9 C) (Temporal)   Ht '5\' 11"'  (1.803 m)   Wt 203 lb (92.1 kg)   SpO2 98%   BMI 28.31 kg/m   BP Readings from Last 3  Encounters:  12/09/18 122/85  09/24/18 125/85  09/17/18 (!) 137/93    Wt Readings from Last 3 Encounters:  12/09/18 203 lb (92.1 kg)  09/24/18 199 lb 12.8 oz (90.6 kg)  09/17/18 198 lb (89.8 kg)     Physical Exam Vitals signs reviewed.  Constitutional:      Appearance: He is well-developed.  HENT:     Head: Normocephalic and atraumatic.     Right Ear: Tympanic membrane and external ear normal. No decreased hearing noted.     Left Ear: Tympanic membrane and external ear normal. No decreased hearing noted.     Mouth/Throat:     Pharynx: No oropharyngeal exudate or posterior oropharyngeal erythema.  Eyes:     Pupils: Pupils are equal, round, and reactive to light.  Neck:     Musculoskeletal: Normal range of motion and neck supple.  Cardiovascular:     Rate and Rhythm: Normal rate and regular rhythm.     Heart sounds: No murmur.  Pulmonary:     Effort: No respiratory distress.     Breath sounds: Normal breath sounds.  Abdominal:     General: Bowel sounds are normal.     Palpations: Abdomen is soft. There is no mass.     Tenderness: There is no abdominal tenderness.  Assessment & Plan:   Kevin Hughes was seen today for at times he gets hot.  Diagnoses and all orders for this visit:  Dizziness -     CMP14+EGFR  Nausea -     CMP14+EGFR       I have discontinued Kevin Hughes's terbinafine, predniSONE, and doxycycline. I am also having him maintain his cetirizine, fluticasone, rizatriptan, metroNIDAZOLE, Vitamin D (Ergocalciferol), and cyclobenzaprine.  Allergies as of 12/09/2018   No Known Allergies     Medication List       Accurate as of December 09, 2018  5:11 PM. If you have any questions, ask your nurse or doctor.        STOP taking these medications   doxycycline 100 MG capsule Commonly known as: VIBRAMYCIN Stopped by: Claretta Fraise, MD   predniSONE 5 MG tablet Commonly known as: DELTASONE Stopped by: Claretta Fraise, MD   terbinafine 250 MG  tablet Commonly known as: LamISIL Stopped by: Claretta Fraise, MD     TAKE these medications   cetirizine 10 MG tablet Commonly known as: ZYRTEC Take 1 tablet (10 mg total) by mouth daily. For allergy and headache prevention   cyclobenzaprine 10 MG tablet Commonly known as: FLEXERIL Take 1 tablet (10 mg total) by mouth 3 (three) times daily as needed for muscle spasms.   fluticasone 50 MCG/ACT nasal spray Commonly known as: FLONASE SPRAY 2 SPRAYS INTO EACH NOSTRIL EVERY DAY   metroNIDAZOLE 1 % gel Commonly known as: Metrogel Apply topically daily. For roscea   rizatriptan 10 MG disintegrating tablet Commonly known as: MAXALT-MLT Take 1 tablet (10 mg total) by mouth as needed for migraine. May repeat in 2 hours if needed   Vitamin D (Ergocalciferol) 1.25 MG (50000 UT) Caps capsule Commonly known as: DRISDOL Take 1 capsule (50,000 Units total) by mouth every 7 (seven) days.       Discontinue the doxycycline.  After period of 2 weeks let me know if the spells are continuing.  Let me know sooner if the spells take a turn for the worse.  If the spells have resolved, then we know it was the doxycycline and you will need a different medicine for the rosacea and acne.  On the other hand, if the spells have not resolved we will need to look further into the differential diagnosis of your current symptoms. Follow-up: No follow-ups on file.  Claretta Fraise, M.D.

## 2018-12-10 LAB — CMP14+EGFR
ALT: 35 IU/L (ref 0–44)
AST: 23 IU/L (ref 0–40)
Albumin/Globulin Ratio: 1.7 (ref 1.2–2.2)
Albumin: 4.3 g/dL (ref 4.0–5.0)
Alkaline Phosphatase: 89 IU/L (ref 39–117)
BUN/Creatinine Ratio: 15 (ref 9–20)
BUN: 15 mg/dL (ref 6–20)
Bilirubin Total: 0.3 mg/dL (ref 0.0–1.2)
CO2: 26 mmol/L (ref 20–29)
Calcium: 9 mg/dL (ref 8.7–10.2)
Chloride: 103 mmol/L (ref 96–106)
Creatinine, Ser: 0.99 mg/dL (ref 0.76–1.27)
GFR calc Af Amer: 114 mL/min/{1.73_m2} (ref >59)
GFR calc non Af Amer: 98 mL/min/{1.73_m2} (ref >59)
Globulin, Total: 2.5 g/dL (ref 1.5–4.5)
Glucose: 103 mg/dL — ABNORMAL HIGH (ref 65–99)
Potassium: 4 mmol/L (ref 3.5–5.2)
Sodium: 143 mmol/L (ref 134–144)
Total Protein: 6.8 g/dL (ref 6.0–8.5)

## 2018-12-10 NOTE — Progress Notes (Signed)
Hello Amadou,  Your lab result is normal and/or stable.Some minor variations that are not significant are commonly marked abnormal, but do not represent any medical problem for you.  Best regards, Virna Livengood, M.D.

## 2018-12-14 MED FILL — VIT D2 1.25 MG (50,000 UNIT: 1.25 MG | 70 days supply | Qty: 10 | Fill #1

## 2018-12-14 MED FILL — CYCLOBENZAPRINE 10 MG TAB: 10 | 10 days supply | Qty: 30 | Fill #0

## 2019-01-06 MED FILL — RIZATRIPTAN 10 MG ODT: 10 | 30 days supply | Qty: 9 | Fill #2

## 2019-02-15 MED FILL — CYCLOBENZAPRINE 10 MG TAB: 10 | 10 days supply | Qty: 30 | Fill #1

## 2019-02-17 ENCOUNTER — Encounter: Payer: Self-pay | Admitting: Family Medicine

## 2019-03-01 ENCOUNTER — Encounter: Payer: Self-pay | Admitting: Family Medicine

## 2019-03-01 ENCOUNTER — Other Ambulatory Visit: Payer: Self-pay

## 2019-03-01 ENCOUNTER — Ambulatory Visit: Payer: 59 | Admitting: Family Medicine

## 2019-03-01 VITALS — BP 132/85 | HR 73 | Temp 96.6°F | Resp 20 | Ht 71.0 in | Wt 203.0 lb

## 2019-03-01 DIAGNOSIS — E559 Vitamin D deficiency, unspecified: Secondary | ICD-10-CM | POA: Diagnosis not present

## 2019-03-01 DIAGNOSIS — M545 Low back pain, unspecified: Secondary | ICD-10-CM

## 2019-03-01 DIAGNOSIS — G43009 Migraine without aura, not intractable, without status migrainosus: Secondary | ICD-10-CM | POA: Diagnosis not present

## 2019-03-01 DIAGNOSIS — L719 Rosacea, unspecified: Secondary | ICD-10-CM

## 2019-03-01 MED ORDER — FLUTICASONE PROPIONATE 50 MCG/ACT NA SUSP: NASAL | 1 refills | Status: DC

## 2019-03-01 MED ORDER — CYCLOBENZAPRINE HCL 10 MG PO TABS: 10.0000 mg | ORAL_TABLET | Freq: Three times a day (TID) | ORAL | 5 refills | Status: DC | PRN

## 2019-03-01 MED ORDER — METRONIDAZOLE 1 % EX GEL: Freq: Every day | CUTANEOUS | 11 refills | Status: DC

## 2019-03-01 MED ORDER — VITAMIN D (ERGOCALCIFEROL) 1.25 MG (50000 UNIT) PO CAPS: 50000.0000 [IU] | ORAL_CAPSULE | ORAL | 1 refills | Status: DC

## 2019-03-01 MED ORDER — RIZATRIPTAN BENZOATE 10 MG PO TBDP: 10.0000 mg | ORAL_TABLET | ORAL | 6 refills | Status: DC | PRN

## 2019-03-01 NOTE — Progress Notes (Signed)
Subjective:  Patient ID: Kevin Hughes, male    DOB: 01/16/1986  Age: 33 y.o. MRN: [id number]  CC: Recheck Vitamin D   HPI Kevin Hughes presents for six mos recheck for migraine, rosacea and low vitamin D. Face seems dry. Still some breaking out. Back better. Occasioinally uses flexeril. MIgraines are not occurring very often. Maxalt helps.   Depression screen [facility] 2/9 03/01/2019 12/09/2018 09/24/2018  Decreased Interest 0 0 0  Down, Depressed, Hopeless 0 0 0  PHQ - 2 Score 0 0 0    History Kevin Hughes has a past medical history of Stroke ([place]).   He has a past surgical history that includes Wisdom tooth extraction and Finger surgery.   His family history includes Diabetes in his paternal grandfather; Hypertension in his mother; Throat cancer in his paternal grandfather.He reports that he has never smoked. He has never used smokeless tobacco. He reports that he does not drink alcohol or use drugs.    ROS Review of Systems  Constitutional: Negative for fever.  Respiratory: Negative for shortness of breath.   Cardiovascular: Negative for chest pain.  Musculoskeletal: Negative for arthralgias.  Skin: Negative for rash.    Objective:  BP 132/85   Pulse 73   Temp (!) 96.6 F (35.9 C) (Temporal)   Resp 20   Ht 5\' 11"  (1.803 m)   Wt 203 lb (92.1 kg)   SpO2 96%   BMI 28.31 kg/m   BP Readings from Last 3 Encounters:  03/01/19 132/85  12/09/18 122/85  09/24/18 125/85    Wt Readings from Last 3 Encounters:  03/01/19 203 lb (92.1 kg)  12/09/18 203 lb (92.1 kg)  09/24/18 199 lb 12.8 oz (90.6 kg)     Physical Exam Vitals reviewed.  Constitutional:      Appearance: He is well-developed.  HENT:     Head: Normocephalic and atraumatic.     Right Ear: External ear normal.     Left Ear: External ear normal.     Mouth/Throat:     Pharynx: No oropharyngeal exudate or posterior oropharyngeal erythema.  Eyes:     Pupils: Pupils are equal, round, and reactive to light.    Cardiovascular:     Rate and Rhythm: Normal rate and regular rhythm.     Heart sounds: No murmur.  Pulmonary:     Effort: No respiratory distress.     Breath sounds: Normal breath sounds.  Musculoskeletal:     Cervical back: Normal range of motion and neck supple.  Skin:    General: Skin is warm and dry.     Findings: Rash (minimal papular eruption on cheeks. Slight scale at hairline of beard and at nasal alae) present.  Neurological:     Mental Status: He is alert and oriented to person, place, and time.       Assessment & Plan:   Kevin Hughes was seen today for recheck vitamin d.  Diagnoses and all orders for this visit:  Migraine without aura and without status migrainosus, not intractable -     CMP -     CBC  Lumbar pain  Vitamin D deficiency -     VITAMIN D  Rosacea  Other orders -     metroNIDAZOLE (METROGEL) 1 % gel; Apply topically daily. For roscea -     rizatriptan (MAXALT-MLT) 10 MG disintegrating tablet; Take 1 tablet (10 mg total) by mouth as needed for migraine. May repeat in 2 hours if needed -     fluticasone (  FLONASE) 50 MCG/ACT nasal spray; SPRAY 2 SPRAYS INTO EACH NOSTRIL EVERY DAY -     Vitamin D, Ergocalciferol, (DRISDOL) 1.25 MG (50000 UT) CAPS capsule; Take 1 capsule (50,000 Units total) by mouth every 7 (seven) days. -     cyclobenzaprine (FLEXERIL) 10 MG tablet; Take 1 tablet (10 mg total) by mouth 3 (three) times daily as needed for muscle spasms.       I am having Kevin Hughes maintain his cetirizine, metroNIDAZOLE, rizatriptan, fluticasone, Vitamin D (Ergocalciferol), and cyclobenzaprine.  Allergies as of 03/01/2019   No Known Allergies     Medication List       Accurate as of March 01, 2019  4:46 PM. If you have any questions, ask your nurse or doctor.        cetirizine 10 MG tablet Commonly known as: ZYRTEC Take 1 tablet (10 mg total) by mouth daily. For allergy and headache prevention   cyclobenzaprine 10 MG  tablet Commonly known as: FLEXERIL Take 1 tablet (10 mg total) by mouth 3 (three) times daily as needed for muscle spasms.   fluticasone 50 MCG/ACT nasal spray Commonly known as: FLONASE SPRAY 2 SPRAYS INTO EACH NOSTRIL EVERY DAY   metroNIDAZOLE 1 % gel Commonly known as: Metrogel Apply topically daily. For roscea   rizatriptan 10 MG disintegrating tablet Commonly known as: MAXALT-MLT Take 1 tablet (10 mg total) by mouth as needed for migraine. May repeat in 2 hours if needed   Vitamin D (Ergocalciferol) 1.25 MG (50000 UT) Caps capsule Commonly known as: DRISDOL Take 1 capsule (50,000 Units total) by mouth every 7 (seven) days.        Follow-up: Return in about 6 months (around 08/30/2019) for Wellness.  Mechele Claude, M.D.

## 2019-03-02 LAB — CBC WITH DIFFERENTIAL/PLATELET
Basophils Absolute: 0 10*3/uL (ref 0.0–0.2)
Basos: 1 %
EOS (ABSOLUTE): 0 10*3/uL (ref 0.0–0.4)
Eos: 1 %
Hematocrit: 42.7 % (ref 37.5–51.0)
Hemoglobin: 15 g/dL (ref 13.0–17.7)
Immature Grans (Abs): 0 10*3/uL (ref 0.0–0.1)
Immature Granulocytes: 0 %
Lymphocytes Absolute: 1.9 10*3/uL (ref 0.7–3.1)
Lymphs: 33 %
MCH: 30.3 pg (ref 26.6–33.0)
MCHC: 35.1 g/dL (ref 31.5–35.7)
MCV: 86 fL (ref 79–97)
Monocytes Absolute: 0.5 10*3/uL (ref 0.1–0.9)
Monocytes: 9 %
Neutrophils Absolute: 3.2 10*3/uL (ref 1.4–7.0)
Neutrophils: 56 %
Platelets: 290 10*3/uL (ref 150–450)
RBC: 4.95 x10E6/uL (ref 4.14–5.80)
RDW: 12 % (ref 11.6–15.4)
WBC: 5.7 10*3/uL (ref 3.4–10.8)

## 2019-03-02 LAB — CMP14+EGFR
ALT: 31 IU/L (ref 0–44)
AST: 24 IU/L (ref 0–40)
Albumin/Globulin Ratio: 1.9 (ref 1.2–2.2)
Albumin: 4.6 g/dL (ref 4.0–5.0)
Alkaline Phosphatase: 81 IU/L (ref 39–117)
BUN/Creatinine Ratio: 13 (ref 9–20)
BUN: 15 mg/dL (ref 6–20)
Bilirubin Total: 0.3 mg/dL (ref 0.0–1.2)
CO2: 25 mmol/L (ref 20–29)
Calcium: 9.1 mg/dL (ref 8.7–10.2)
Chloride: 102 mmol/L (ref 96–106)
Creatinine, Ser: 1.15 mg/dL (ref 0.76–1.27)
GFR calc Af Amer: 95 mL/min/{1.73_m2} (ref >59)
GFR calc non Af Amer: 82 mL/min/{1.73_m2} (ref >59)
Globulin, Total: 2.4 g/dL (ref 1.5–4.5)
Glucose: 93 mg/dL (ref 65–99)
Potassium: 4.1 mmol/L (ref 3.5–5.2)
Sodium: 141 mmol/L (ref 134–144)
Total Protein: 7 g/dL (ref 6.0–8.5)

## 2019-03-02 LAB — VITAMIN D 25 HYDROXY (VIT D DEFICIENCY, FRACTURES): Vit D, 25-Hydroxy: 38.7 ng/mL (ref 30.0–100.0)

## 2019-03-03 MED FILL — VIT D2 1.25 MG (50,000 UNIT: 1.25 MG | 90 days supply | Qty: 13 | Fill #0

## 2019-03-03 MED FILL — FLUTICASONE PROP 50 MCG SPR: 50 | 90 days supply | Qty: 48 | Fill #0

## 2019-03-03 MED FILL — CYCLOBENZAPRINE 10 MG TAB: 10 | 10 days supply | Qty: 30 | Fill #0

## 2019-03-03 MED FILL — metroNIDAZOLE 1 % GEL: 1 | 30 days supply | Qty: 60 | Fill #0

## 2019-03-03 MED FILL — RIZATRIPTAN 10 MG ODT: 10 | 30 days supply | Qty: 9 | Fill #0

## 2019-03-15 ENCOUNTER — Telehealth: Payer: 59 | Admitting: Family

## 2019-03-15 DIAGNOSIS — J019 Acute sinusitis, unspecified: Secondary | ICD-10-CM

## 2019-03-15 DIAGNOSIS — B9689 Other specified bacterial agents as the cause of diseases classified elsewhere: Secondary | ICD-10-CM

## 2019-03-15 MED ORDER — AMOXICILLIN-POT CLAVULANATE 875-125 MG PO TABS: 1.0000 | ORAL_TABLET | Freq: Two times a day (BID) | ORAL | 0 refills | Status: DC

## 2019-03-15 NOTE — Progress Notes (Signed)

## 2019-04-08 ENCOUNTER — Other Ambulatory Visit: Payer: Self-pay

## 2019-04-08 ENCOUNTER — Ambulatory Visit: Payer: 59 | Admitting: Family Medicine

## 2019-04-08 ENCOUNTER — Encounter: Payer: Self-pay | Admitting: Family Medicine

## 2019-04-08 VITALS — BP 123/89 | HR 75 | Temp 98.0°F | Ht 71.0 in | Wt 204.8 lb

## 2019-04-08 DIAGNOSIS — R0683 Snoring: Secondary | ICD-10-CM | POA: Diagnosis not present

## 2019-04-08 DIAGNOSIS — J0100 Acute maxillary sinusitis, unspecified: Secondary | ICD-10-CM

## 2019-04-08 DIAGNOSIS — G43009 Migraine without aura, not intractable, without status migrainosus: Secondary | ICD-10-CM

## 2019-04-08 DIAGNOSIS — J3089 Other allergic rhinitis: Secondary | ICD-10-CM

## 2019-04-08 MED ORDER — PREDNISONE 10 MG PO TABS: ORAL_TABLET | ORAL | 0 refills | Status: DC

## 2019-04-08 MED ORDER — CYCLOBENZAPRINE HCL 10 MG PO TABS: 10.0000 mg | ORAL_TABLET | Freq: Three times a day (TID) | ORAL | 5 refills | Status: DC | PRN

## 2019-04-08 MED ORDER — FEXOFENADINE-PSEUDOEPHED ER 180-240 MG PO TB24: 1.0000 | ORAL_TABLET | Freq: Every evening | ORAL | 11 refills | Status: DC

## 2019-04-08 MED ORDER — RIZATRIPTAN BENZOATE 10 MG PO TBDP: 10.0000 mg | ORAL_TABLET | ORAL | 6 refills | Status: DC | PRN

## 2019-04-08 MED ORDER — AMOXICILLIN-POT CLAVULANATE 875-125 MG PO TABS: 1.0000 | ORAL_TABLET | Freq: Two times a day (BID) | ORAL | 0 refills | Status: DC

## 2019-04-08 MED FILL — CYCLOBENZAPRINE 10 MG TAB: 10 | 10 days supply | Qty: 30 | Fill #1

## 2019-04-08 MED FILL — RIZATRIPTAN 10 MG ODT: 10 | 30 days supply | Qty: 9 | Fill #1

## 2019-04-10 ENCOUNTER — Encounter: Payer: Self-pay | Admitting: Family Medicine

## 2019-04-10 NOTE — Progress Notes (Signed)
Subjective:  Patient ID: Kevin Hughes, male    DOB: [birth year]  Age: 34 y.o. MRN: [id number]  CC: Follow-up (Migraines)   HPI Kevin Hughes presents for recurrent migraine and headaches.  Over the last few weeks he has noticed increasing headaches.  They are lasting about 2 days and not getting adequate relief with Maxalt.  He is taking the Maxalt waiting 2 hours taking again the next day he repeats this.  He is laying down in a cool room and sleeping more as well.  Sleep seems to reduce the discomfort temporarily.  He describes that there is increasing pressure when he bends down.  A couple of days ago he had an episode of vomiting and nausea and felt off balance when bending over.  1 similar occurrence about a week prior to that as well.  The headache is centered near the bridge of the nose and at the right temple.  He describes a tightness at the neck and scalp interface posteriorly.  The pain is described as 7-8/10 at its peak 2 days ago.  Today it is 2/10.  Overall the increased frequency has been ongoing for about a month.  Depression screen [facility] 2/9 04/08/2019 03/01/2019 12/09/2018  Decreased Interest 0 0 0  Down, Depressed, Hopeless 0 0 0  PHQ - 2 Score 0 0 0    History Gerrell has a past medical history of Stroke (HCC).   He has a past surgical history that includes Wisdom tooth extraction and Finger surgery.   His family history includes Diabetes in his paternal grandfather; Hypertension in his mother; Throat cancer in his paternal grandfather.He reports that he has never smoked. He has never used smokeless tobacco. He reports that he does not drink alcohol or use drugs.    ROS Review of Systems  Constitutional: Negative for fever.  HENT: Positive for congestion.   Respiratory: Negative for shortness of breath.   Cardiovascular: Negative for chest pain.  Musculoskeletal: Negative for arthralgias.  Skin: Negative for rash.  Neurological: Positive for headaches.    Objective:   BP 123/89   Pulse 75   Temp 98 F (36.7 C) (Temporal)   Ht 5\' 11"  (1.803 m)   Wt 204 lb 12.8 oz (92.9 kg)   BMI 28.56 kg/m   BP Readings from Last 3 Encounters:  04/08/19 123/89  03/01/19 132/85  12/09/18 122/85    Wt Readings from Last 3 Encounters:  04/08/19 204 lb 12.8 oz (92.9 kg)  03/01/19 203 lb (92.1 kg)  12/09/18 203 lb (92.1 kg)     Physical Exam Constitutional:      General: He is not in acute distress.    Appearance: He is well-developed.  HENT:     Head: Normocephalic and atraumatic.     Right Ear: External ear normal.     Left Ear: External ear normal.     Nose: Nose normal.  Eyes:     Conjunctiva/sclera: Conjunctivae normal.     Pupils: Pupils are equal, round, and reactive to light.  Cardiovascular:     Rate and Rhythm: Normal rate and regular rhythm.     Heart sounds: Normal heart sounds. No murmur.  Pulmonary:     Effort: Pulmonary effort is normal. No respiratory distress.     Breath sounds: Normal breath sounds. No wheezing or rales.  Abdominal:     Palpations: Abdomen is soft.     Tenderness: There is no abdominal tenderness.  Musculoskeletal:  General: Normal range of motion.     Cervical back: Normal range of motion and neck supple.  Skin:    General: Skin is warm and dry.  Neurological:     Mental Status: He is alert and oriented to person, place, and time.     Deep Tendon Reflexes: Reflexes are normal and symmetric.  Psychiatric:        Behavior: Behavior normal.        Thought Content: Thought content normal.        Judgment: Judgment normal.       Assessment & Plan:   Ramond was seen today for follow-up.  Diagnoses and all orders for this visit:  Acute maxillary sinusitis, recurrence not specified -     amoxicillin-clavulanate (AUGMENTIN) 875-125 MG tablet; Take 1 tablet by mouth 2 (two) times daily. Take all of this medication -     predniSONE (DELTASONE) 10 MG tablet; Take 5 daily for 3 days followed by 4,3,2 and  1 for 3 days each.  Migraine without aura and without status migrainosus, not intractable -     cyclobenzaprine (FLEXERIL) 10 MG tablet; Take 1 tablet (10 mg total) by mouth 3 (three) times daily as needed for muscle spasms. -     rizatriptan (MAXALT-MLT) 10 MG disintegrating tablet; Take 1 tablet (10 mg total) by mouth as needed for migraine. May repeat in 2 hours if needed  Non-seasonal allergic rhinitis, unspecified trigger -     predniSONE (DELTASONE) 10 MG tablet; Take 5 daily for 3 days followed by 4,3,2 and 1 for 3 days each. -     fexofenadine-pseudoephedrine (ALLEGRA-D 24) 180-240 MG 24 hr tablet; Take 1 tablet by mouth every evening. For allergy and congestion  Snores -     Ambulatory referral to Sleep Studies   Patient reports multiple symptoms of migraine and allergy.  Additionally there is evidence for acute maxillary sinusitis.  He likely has some chronic sinus overlay as well.  At this point his history of migraines is apparent and that his allergy and sinus symptoms are exacerbating that is acting as a trigger for his migraines.  As a corollary to his allergy and congestion he notes that he snores heavily.  Recently it has been regardless of position.  His wife says she cannot get him to stop snoring no matter what she does having him rollover etc.  He requests a sleep study as a result.    I have discontinued Haley Palau's cetirizine and amoxicillin-clavulanate. I am also having him start on amoxicillin-clavulanate, predniSONE, and fexofenadine-pseudoephedrine. Additionally, I am having him maintain his metroNIDAZOLE, fluticasone, Vitamin D (Ergocalciferol), cyclobenzaprine, and rizatriptan.  Allergies as of 04/08/2019   No Known Allergies     Medication List       Accurate as of April 08, 2019 11:59 PM. If you have any questions, ask your nurse or doctor.        STOP taking these medications   cetirizine 10 MG tablet Commonly known as: ZYRTEC Stopped by: Claretta Fraise, MD     TAKE these medications   amoxicillin-clavulanate 875-125 MG tablet Commonly known as: AUGMENTIN Take 1 tablet by mouth 2 (two) times daily. Take all of this medication What changed: additional instructions Changed by: Claretta Fraise, MD   cyclobenzaprine 10 MG tablet Commonly known as: FLEXERIL Take 1 tablet (10 mg total) by mouth 3 (three) times daily as needed for muscle spasms.   fexofenadine-pseudoephedrine 180-240 MG 24 hr tablet Commonly known as:  ALLEGRA-D 24 Take 1 tablet by mouth every evening. For allergy and congestion Started by: Mechele Claude, MD   fluticasone 50 MCG/ACT nasal spray Commonly known as: FLONASE SPRAY 2 SPRAYS INTO EACH NOSTRIL EVERY DAY   metroNIDAZOLE 1 % gel Commonly known as: Metrogel Apply topically daily. For roscea   predniSONE 10 MG tablet Commonly known as: DELTASONE Take 5 daily for 3 days followed by 4,3,2 and 1 for 3 days each. Started by: Mechele Claude, MD   rizatriptan 10 MG disintegrating tablet Commonly known as: MAXALT-MLT Take 1 tablet (10 mg total) by mouth as needed for migraine. May repeat in 2 hours if needed   Vitamin D (Ergocalciferol) 1.25 MG (50000 UNIT) Caps capsule Commonly known as: DRISDOL Take 1 capsule (50,000 Units total) by mouth every 7 (seven) days.        Follow-up: Return in about 6 weeks (around 05/20/2019).  Mechele Claude, M.D.

## 2019-04-21 ENCOUNTER — Ambulatory Visit (INDEPENDENT_AMBULATORY_CARE_PROVIDER_SITE_OTHER): Payer: 59 | Admitting: Neurology

## 2019-04-21 ENCOUNTER — Encounter: Payer: Self-pay | Admitting: Neurology

## 2019-04-21 ENCOUNTER — Other Ambulatory Visit: Payer: Self-pay

## 2019-04-21 VITALS — BP 136/85 | HR 62 | Temp 97.9°F | Ht 70.0 in | Wt 199.5 lb

## 2019-04-21 DIAGNOSIS — R0683 Snoring: Secondary | ICD-10-CM | POA: Diagnosis not present

## 2019-04-21 DIAGNOSIS — G43009 Migraine without aura, not intractable, without status migrainosus: Secondary | ICD-10-CM

## 2019-04-21 DIAGNOSIS — R519 Headache, unspecified: Secondary | ICD-10-CM | POA: Diagnosis not present

## 2019-04-21 DIAGNOSIS — L739 Follicular disorder, unspecified: Secondary | ICD-10-CM | POA: Diagnosis not present

## 2019-04-21 DIAGNOSIS — M545 Low back pain, unspecified: Secondary | ICD-10-CM

## 2019-04-21 NOTE — Progress Notes (Signed)
SLEEP MEDICINE CLINIC    Provider:  Melvyn Novas, MD  Primary Care Physician:  Mechele Claude, MD [address] [place] [address]     Referring Provider: Mechele Claude, Md [address] [place],  [place] [address]          Chief Complaint according to patient   Patient presents with:    . New Patient (Initial Visit)           HISTORY OF PRESENT ILLNESS:  Kevin Hughes is a 34 year old  Caucasian male patient seen  on 04/21/2019 . Chief concern according to patient : " my wife has complaint about my loud snoring". He also stated he has more frequent headaches, sinusitis, had an MRI brain with Dr Marjory Lies. .    I have the pleasure of seeing Kevin Hughes today, a right -handed Caucasian male with a possible sleep disorder.  He  has a past medical history of Migraine and Stroke (HCC).    Sleep relevant medical history: Nocturia; none ,no Tonsillectomy, but *wisdom teeth removed.   Family medical /sleep history: mother is a loud snorer- no other family member with known OSA.   Social history:  Patient is working as an Gaffer and lives in a household with 3 persons/-spouse and a 75 year old son. The patient currently works days. Pets are present- bearded dragon.. Tobacco use: never .  ETOH use: rare . Caffeine intake in form of Coffee( none) Soda(  2 cans a day) Tea ( 1 glass at dinner ) or energy drinks. Regular exercise- none .     Sleep habits are as follows: The patient's dinner time is between 6.30 PM. The patient goes to bed at 11.30 PM and continues to sleep for 3-4 hours, never wakes from snoring. The preferred sleep position is supine / prone and on the side , with the support of 2 pillows.  Adjustable bed  Dreams are reportedly frequent.  7  AM is the usual rise time.  The patient wakes up at 6 with an alarm.  Hereports not feeling refreshed or restored in AM, with symptoms such as dry mouth/ parched , frequent  morning headaches- nauseated,- he is not woken  by headaches. and residual fatigue. He is a mouth breather. Allergic rhinitis and sinusitis, non-seasonal. He is allergic to dog hair.   Naps are taken infrequently.  Review of Systems: Out of a complete 14 system review, the patient complains of only the following symptoms, and all other reviewed systems are negative.:  Fatigue, sleepiness , snoring, fragmented sleep.headches, sinisitis.    How likely are you to doze in the following situations: 0 = not likely, 1 = slight chance, 2 = moderate chance, 3 = high chance   Sitting and Reading? Watching Television? Sitting inactive in a public place (theater or meeting)? As a passenger in a car for an hour without a break? Lying down in the afternoon when circumstances permit? Sitting and talking to someone? Sitting quietly after lunch without alcohol? In a car, while stopped for a few minutes in traffic?   Total = 2/ 24 points   FSS endorsed at 14/ 63 points.   Social History   Socioeconomic History  . Marital status: Married    Spouse name: [name]  . Number of children: 1  . Years of education: Not on file  . Highest education level: Bachelor's degree (e.g., BA, AB, BS)  Occupational History  . Not on file  Tobacco Use  . Smoking status: Never Smoker  . Smokeless tobacco: Never Used  Substance and Sexual Activity  . Alcohol use: No  . Drug use: No  . Sexual activity: Not on file  Other Topics Concern  . Not on file  Social History Narrative   ** Merged History Encounter **    Lives at home with wife, son and mother in Personnel officer education   Caffeine- 2 sodas daily   Social Determinants of Health   Financial Resource Strain:   . Difficulty of Paying Living Expenses: Not on file  Food Insecurity:   . Worried About Charity fundraiser in the Last Year: Not on file  . Ran Out of Food in the Last Year: Not on file  Transportation Needs:   . Lack of Transportation (Medical): Not on file  .  Lack of Transportation (Non-Medical): Not on file  Physical Activity:   . Days of Exercise per Week: Not on file  . Minutes of Exercise per Session: Not on file  Stress:   . Feeling of Stress : Not on file  Social Connections:   . Frequency of Communication with Friends and Family: Not on file  . Frequency of Social Gatherings with Friends and Family: Not on file  . Attends Religious Services: Not on file  . Active Member of Clubs or Organizations: Not on file  . Attends Archivist Meetings: Not on file  . Marital Status: Not on file    Family History  Problem Relation Age of Onset  . Hypertension Mother   . Throat cancer Paternal Grandfather   . Diabetes Paternal Grandfather   . Dementia Paternal Grandmother     Past Medical History:  Diagnosis Date  . Migraine   . Stroke [facility])    "possible stroke per MRI"    Past Surgical History:  Procedure Laterality Date  . FINGER SURGERY    . WISDOM TOOTH EXTRACTION       Current Outpatient Medications on File Prior to Visit  Medication Sig Dispense Refill  . cyclobenzaprine (FLEXERIL) 10 MG tablet Take 1 tablet (10 mg total) by mouth 3 (three) times daily as needed for muscle spasms. 30 tablet 5  . fexofenadine-pseudoephedrine (ALLEGRA-D 24) 180-240 MG 24 hr tablet Take 1 tablet by mouth every evening. For allergy and congestion 30 tablet 11  . fluticasone (FLONASE) 50 MCG/ACT nasal spray SPRAY 2 SPRAYS INTO EACH NOSTRIL EVERY DAY 48 g 1  . metroNIDAZOLE (METROGEL) 1 % gel Apply topically daily. For roscea 45 g 11  . rizatriptan (MAXALT-MLT) 10 MG disintegrating tablet Take 1 tablet (10 mg total) by mouth as needed for migraine. May repeat in 2 hours if needed 9 tablet 6  . Vitamin D, Ergocalciferol, (DRISDOL) 1.25 MG (50000 UT) CAPS capsule Take 1 capsule (50,000 Units total) by mouth every 7 (seven) days. 13 capsule 1   No current facility-administered medications on file prior to visit.    No Known  Allergies  Physical exam:  Today's Vitals   04/21/19 0831  BP: 136/85  Pulse: 62  Temp: 97.9 F (36.6 C)  Weight: 199 lb 8 oz (90.5 kg)  Height: 5\' 10"  (1.778 m)   Body mass index is 28.63 kg/m.   Wt Readings from Last 3 Encounters:  04/21/19 199 lb 8 oz (90.5 kg)  04/08/19 204 lb 12.8 oz (92.9 kg)  03/01/19 203 lb (92.1 kg)     Ht  Readings from Last 3 Encounters:  04/21/19 5\' 10"  (1.778 m)  04/08/19 5\' 11"  (1.803 m)  03/01/19 5\' 11"  (1.803 m)      General: The patient is awake, alert and appears not in acute distress. The patient is well groomed. Head: Normocephalic, atraumatic. Neck is supple. Mallampati 3,  neck circumference:16 inches .  Nasal airflow  patent.  Retrognathia is seen. There appears to be a slight crossbite.  Dental status:  Cardiovascular:  Regular rate and cardiac rhythm by pulse,  without distended neck veins. Respiratory: Lungs are clear to auscultation.  Skin:  Without evidence of ankle edema, or rash. Trunk: The patient's posture is erect.   Neurologic exam : The patient is awake and alert, oriented to place and time.   Memory subjective described as intact.  Attention span & concentration ability appears normal.  Speech is fluent,  without  dysarthria, dysphonia or aphasia.  Mood and affect are appropriate.   Cranial nerves: no loss of smell or taste reported  Pupils are equal and briskly reactive to light. Funduscopic exam deferred.   Extraocular movements in vertical and horizontal planes were intact and without nystagmus. No Diplopia. Visual fields by finger perimetry are intact. Hearing was intact to soft voice and finger rubbing.   Facial sensation intact to fine touch. Facial motor strength is symmetric and tongue and uvula move midline.  Neck ROM : rotation, tilt and flexion extension were normal for age and shoulder shrug was symmetrical.    Motor exam:  Symmetric bulk, tone and ROM.   Normal tone without cog wheeling, symmetric  grip strength .   Sensory:  Fine touch, pinprick and vibration were tested  and  normal.  Proprioception tested in the upper extremities was normal.   Coordination: Rapid alternating movements in the fingers/hands were of normal speed.  The Finger-to-nose maneuver was intact without evidence of ataxia, dysmetria or tremor.   Gait and station: Patient could rise unassisted from a seated position, walked without assistive device.  Stance is of normal width/ base and the patient was observed turning with  3 steps.  Toe and heel walk were deferred.  Deep tendon reflexes: in the  upper and lower extremities are symmetric and 2 plus.   Babinski response was deferred.        After spending a total time of 40  minutes face to face and additional time for physical and neurologic examination, review of laboratory studies,  personal review of imaging studies, reports and results of other testing and review of referral information / records as far as provided in visit, I have established the following assessments:  Mr. Kazee recent risk factors for obstructive sleep apnea are listed below and upcoming.  He has been a snorer for many years but his snoring has become less positional dependent and louder overall.  His sleep is less restorative less refreshing.  The truly bothersome component for him is that he wakes up with a headache and a dry mouth. He has not been formally tested for allergies but recurrent sinusitis has been a problem.  He is also exposed at his workplace to gases and volatile substances. My goal is to obtain a sleep study since he has lost screening for sleep apnea and not for periodic limb movements or seizures or any other more complex sleep disorder I think that a home sleep test would be sufficient. His headaches are most frequent upon sleep arousal than in daytime.   1)  sinusitis ,  crossbite, and retrognathia. Oral anatomy is supportive of OSA risk.  2)  BMI 28, normal muscle  tone.  3)  Please note: there no pulmonary disorder, no SOB>    My Plan is to proceed with:  HST   I would like to thank Mechele Claude, MD and Luciano Cutter, MD [address] [place],  [place] [address] for allowing me to meet with and to take care of this pleasant patient.   I I plan to follow up either personally or through our NP within 2-3 month.   CC: I will share my notes with PCP   Electronically signed by: Melvyn Novas, MD 04/21/2019 9:06 AM  [facility] Board certified by The [employer] of Sleep Medicine and Diplomate of the [employer] of Sleep Medicine. Board certified In Neurology through the ABPN, Fellow of the [employer] of Neurology. Medical Director of [facility].

## 2019-04-22 ENCOUNTER — Encounter: Payer: Self-pay | Admitting: Family Medicine

## 2019-05-10 ENCOUNTER — Ambulatory Visit (INDEPENDENT_AMBULATORY_CARE_PROVIDER_SITE_OTHER): Payer: 59 | Admitting: Neurology

## 2019-05-10 DIAGNOSIS — M545 Low back pain, unspecified: Secondary | ICD-10-CM

## 2019-05-10 DIAGNOSIS — R0683 Snoring: Secondary | ICD-10-CM

## 2019-05-10 DIAGNOSIS — G4733 Obstructive sleep apnea (adult) (pediatric): Secondary | ICD-10-CM

## 2019-05-10 DIAGNOSIS — R519 Headache, unspecified: Secondary | ICD-10-CM

## 2019-05-10 DIAGNOSIS — L739 Follicular disorder, unspecified: Secondary | ICD-10-CM

## 2019-05-10 DIAGNOSIS — G43009 Migraine without aura, not intractable, without status migrainosus: Secondary | ICD-10-CM

## 2019-05-17 DIAGNOSIS — H5203 Hypermetropia, bilateral: Secondary | ICD-10-CM | POA: Diagnosis not present

## 2019-05-21 DIAGNOSIS — R0683 Snoring: Secondary | ICD-10-CM | POA: Insufficient documentation

## 2019-05-21 NOTE — Progress Notes (Signed)
Summary & Diagnosis:   This HST revealed trend to bradycardia , no hypoxemia and no  clinically significant sleep apnea at AHI of 4/h. There is snoring present, however.   Recommendations:    No need for CPAP intervention. Snoring can still be treated by a  dental device and seems to be positional dependent - the patient  should avoid supine sleep.   Interpreting Physician: Melvyn Novas, MD

## 2019-05-21 NOTE — Procedures (Signed)
  Patient Information     First Name: Kevin Last Name: Daeshon Hughes: [id number]  Birth Date: June 01, 1985 Age: 34 Gender: Male  Referring Provider: Mechele Claude, MD BMI: 28.4 (W=198 lb, H=5' 10'')  Neck Circ.:  16 '' Epworth:  2/24   Sleep Study Information    Study Date: May 10, 2019 S/H/A Version: [id number] / 4.0.1515 / 30  History:    Teodor Prater is a 34 year old Caucasian male patient seen on 04/21/2019 . Chief concern according to patien : " my wife has complaint about my loud snoring". He also stated he has more frequent headaches, sinusitis, had already an MRI of the brain with Dr. Marjory Lies.  Ambrosio is a right -handed Caucasian male with a possible sleep disorder.  He has a medical history of Migraine and Stroke (HCC).      Summary & Diagnosis:     This HST revealed trend to bradycardia , no hypoxemia and no clinically significant sleep apnea.   Recommendations:     No need for CPAP intervention. Snoring can still be treated by a dental device and seems to be positional dependent - the patient should avoid supine sleep.   Interpreting Physician: Melvyn Novas, MD              Sleep Summary    Oxygen Saturation Statistics     Start Study Time: End Study Time: Total Recording Time:       10:10:06 PM 6:25:26 AM 8 h, 15 min  Total Sleep Time % REM of Sleep Time:  7 h, 36 min  34.6    Mean: 94 Minimum: 90 Maximum: 98  Mean of Desaturations Nadirs (%):   91  Oxygen Desaturation. %: 4-9 10-20 >20 Total  Events Number Total  4 100.0  0 0.0  0 0.0  4 100.0  Oxygen Saturation: <90 <=88 <85 <80 <70  Duration (minutes): Sleep % 0.0 0.0 0.0 0.0 0.0 0.0 0.0 0.0 0.0 0.0     Respiratory Indices      Total Events REM NREM All Night  pRDI:  83  pAHI:  30 ODI:  4  pAHIc:  0  % CSR: 0.0 18.7 5.1 0.8 0.0 7.1 3.4 0.4 0.0 11.0 4.0 0.5 0.0       Pulse Rate Statistics during Sleep (BPM)      Mean: 61 Minimum: 43 Maximum: 97    Indices are  calculated using technically valid sleep time of  7 h, 32 min. pRDI/pAHI are calculated using oxi desaturations ? 3%  Body Position Statistics  Position Supine Prone Right Left Non-Supine  Sleep (min) 71.0 90.3 134.0 160.5 384.8  Sleep % 15.5 19.8 29.3 35.1 84.2  pRDI 27.2 8.8 10.9 5.3 8.0  pAHI 18.7 1.4 1.8 0.8 1.3  ODI 3.4 0.0 0.0 0.0 0.0     Snoring Statistics Snoring Level (dB) >40 >50 >60 >70 >80 >Threshold (45)  Sleep (min) 314.7 15.6 2.2 0.3 0.0 51.6  Sleep % 68.9 3.4 0.5 0.1 0.0 11.3    Mean: 42 dB

## 2019-05-24 ENCOUNTER — Telehealth: Payer: Self-pay | Admitting: Neurology

## 2019-05-24 NOTE — Telephone Encounter (Signed)
-----   Message from Melvyn Novas, MD sent at 05/21/2019 11:10 AM EST ----- Summary & Diagnosis:   This HST revealed trend to bradycardia , no hypoxemia and no  clinically significant sleep apnea at AHI of 4/h. There is snoring present, however.   Recommendations:    No need for CPAP intervention. Snoring can still be treated by a  dental device and seems to be positional dependent - the patient  should avoid supine sleep.   Interpreting Physician: Melvyn Novas, MD

## 2019-05-24 NOTE — Telephone Encounter (Signed)
Called the patient and advised the sleep study was normal. There was no concerns of needing any sleep intervention. Pt verbalized understanding and had no questions.

## 2019-05-28 MED FILL — RIZATRIPTAN 10 MG ODT: 10 | 30 days supply | Qty: 9 | Fill #2

## 2019-05-28 MED FILL — CYCLOBENZAPRINE HCL 10 MG T: 10 | 10 days supply | Qty: 30 | Fill #2

## 2019-06-10 MED FILL — VIT D2 1.25 MG (50,000 UNIT: 1.25 MG | 90 days supply | Qty: 13 | Fill #1

## 2019-08-02 ENCOUNTER — Telehealth: Payer: 59 | Admitting: Nurse Practitioner

## 2019-08-02 DIAGNOSIS — J0100 Acute maxillary sinusitis, unspecified: Secondary | ICD-10-CM | POA: Diagnosis not present

## 2019-08-02 MED ORDER — AMOXICILLIN-POT CLAVULANATE 875-125 MG PO TABS: 1.0000 | ORAL_TABLET | Freq: Two times a day (BID) | ORAL | 0 refills | Status: DC

## 2019-08-02 NOTE — Progress Notes (Signed)

## 2019-08-04 DIAGNOSIS — Z03818 Encounter for observation for suspected exposure to other biological agents ruled out: Secondary | ICD-10-CM | POA: Diagnosis not present

## 2019-08-04 DIAGNOSIS — Z20828 Contact with and (suspected) exposure to other viral communicable diseases: Secondary | ICD-10-CM | POA: Diagnosis not present

## 2019-08-06 MED FILL — RIZATRIPTAN 10 MG ODT: 10 | 30 days supply | Qty: 9 | Fill #3

## 2019-08-06 MED FILL — CYCLOBENZAPRINE HCL 10 MG T: 10 | 10 days supply | Qty: 30 | Fill #3

## 2019-08-24 ENCOUNTER — Other Ambulatory Visit: Payer: Self-pay | Admitting: Family Medicine

## 2019-08-24 ENCOUNTER — Encounter: Payer: Self-pay | Admitting: Family Medicine

## 2019-08-24 ENCOUNTER — Other Ambulatory Visit: Payer: Self-pay

## 2019-08-24 ENCOUNTER — Ambulatory Visit (INDEPENDENT_AMBULATORY_CARE_PROVIDER_SITE_OTHER): Payer: 59 | Admitting: Family Medicine

## 2019-08-24 VITALS — BP 128/87 | HR 65 | Temp 97.2°F | Resp 20 | Ht 70.0 in | Wt 201.4 lb

## 2019-08-24 DIAGNOSIS — G43009 Migraine without aura, not intractable, without status migrainosus: Secondary | ICD-10-CM

## 2019-08-24 DIAGNOSIS — Z0000 Encounter for general adult medical examination without abnormal findings: Secondary | ICD-10-CM

## 2019-08-24 DIAGNOSIS — E559 Vitamin D deficiency, unspecified: Secondary | ICD-10-CM | POA: Diagnosis not present

## 2019-08-24 DIAGNOSIS — J3089 Other allergic rhinitis: Secondary | ICD-10-CM

## 2019-08-24 DIAGNOSIS — J301 Allergic rhinitis due to pollen: Secondary | ICD-10-CM | POA: Diagnosis not present

## 2019-08-24 DIAGNOSIS — R0683 Snoring: Secondary | ICD-10-CM | POA: Diagnosis not present

## 2019-08-24 DIAGNOSIS — Z0001 Encounter for general adult medical examination with abnormal findings: Secondary | ICD-10-CM | POA: Diagnosis not present

## 2019-08-24 DIAGNOSIS — B351 Tinea unguium: Secondary | ICD-10-CM | POA: Diagnosis not present

## 2019-08-24 DIAGNOSIS — L719 Rosacea, unspecified: Secondary | ICD-10-CM | POA: Diagnosis not present

## 2019-08-24 LAB — URINALYSIS
Bilirubin, UA: NEGATIVE
Glucose, UA: NEGATIVE
Ketones, UA: NEGATIVE
Leukocytes,UA: NEGATIVE
Nitrite, UA: NEGATIVE
Protein,UA: NEGATIVE
RBC, UA: NEGATIVE
Specific Gravity, UA: 1.02 (ref 1.005–1.030)
Urobilinogen, Ur: 0.2 mg/dL (ref 0.2–1.0)
pH, UA: 7.5 (ref 5.0–7.5)

## 2019-08-24 MED ORDER — RIZATRIPTAN BENZOATE 10 MG PO TBDP: 10.0000 mg | ORAL_TABLET | ORAL | 6 refills | Status: DC | PRN

## 2019-08-24 MED ORDER — CYCLOBENZAPRINE HCL 10 MG PO TABS: 10.0000 mg | ORAL_TABLET | Freq: Three times a day (TID) | ORAL | 5 refills | Status: DC | PRN

## 2019-08-24 MED ORDER — FEXOFENADINE-PSEUDOEPHED ER 180-240 MG PO TB24: 1.0000 | ORAL_TABLET | Freq: Every evening | ORAL | 4 refills | Status: DC

## 2019-08-24 MED ORDER — METRONIDAZOLE 1 % EX GEL: Freq: Every day | CUTANEOUS | 11 refills | Status: DC

## 2019-08-24 NOTE — Progress Notes (Signed)
Subjective:  Patient ID: Kevin Hughes, male    DOB: [birth year]  Age: 34 y.o. MRN: [id number]  CC: Annual Exam   HPI [name] presents for annual physical examination.  Depression screen [facility] 2/9 08/24/2019 04/08/2019 03/01/2019  Decreased Interest 0 0 0  Down, Depressed, Hopeless 0 0 0  PHQ - 2 Score 0 0 0    History Savas has a past medical history of Migraine and Stroke ([place]).   He has a past surgical history that includes Wisdom tooth extraction and Finger surgery.   His family history includes Dementia in his paternal grandmother; Diabetes in his paternal grandfather; Hypertension in his mother; Throat cancer in his paternal grandfather.He reports that he has never smoked. He has never used smokeless tobacco. He reports that he does not drink alcohol or use drugs.    ROS Review of Systems  Constitutional: Negative for activity change, fatigue and unexpected weight change.  HENT: Negative for congestion, ear pain, hearing loss, postnasal drip and trouble swallowing.        Loud snoring.  This has not been amenable to CPAP as his OSA test was negative  Eyes: Negative for pain and visual disturbance.  Respiratory: Negative for cough, chest tightness and shortness of breath.   Cardiovascular: Negative for chest pain, palpitations and leg swelling.  Gastrointestinal: Negative for abdominal distention, abdominal pain, blood in stool, constipation, diarrhea, nausea and vomiting.  Endocrine: Negative for cold intolerance, heat intolerance and polydipsia.  Genitourinary: Negative for difficulty urinating, dysuria, flank pain, frequency and urgency.  Musculoskeletal: Negative for arthralgias and joint swelling.  Skin: Negative for color change, rash and wound.  Neurological: Negative for dizziness, syncope, speech difficulty, weakness, light-headedness, numbness and headaches.  Hematological: Does not bruise/bleed easily.  Psychiatric/Behavioral: Negative for confusion, decreased  concentration, dysphoric mood and sleep disturbance. The patient is not nervous/anxious.     Objective:  BP 128/87   Pulse 65   Temp (!) 97.2 F (36.2 C) (Temporal)   Resp 20   Ht _0  (1.778 m)   Wt 201 lb 6 oz (91.3 kg)   SpO2 98%   BMI 28.89 kg/m   BP Readings from Last 3 Encounters:  08/24/19 128/87  04/21/19 136/85  04/08/19 123/89    Wt Readings from Last 3 Encounters:  08/24/19 201 lb 6 oz (91.3 kg)  04/21/19 199 lb 8 oz (90.5 kg)  04/08/19 204 lb 12.8 oz (92.9 kg)     Physical Exam Constitutional:      Appearance: He is well-developed.  HENT:     Head: Normocephalic and atraumatic.  Eyes:     Pupils: Pupils are equal, round, and reactive to light.  Neck:     Thyroid: No thyromegaly.     Trachea: No tracheal deviation.  Cardiovascular:     Rate and Rhythm: Normal rate and regular rhythm.     Heart sounds: Normal heart sounds. No murmur. No friction rub. No gallop.   Pulmonary:     Breath sounds: Normal breath sounds. No wheezing or rales.  Abdominal:     General: Bowel sounds are normal. There is no distension.     Palpations: Abdomen is soft. There is no mass.     Tenderness: There is no abdominal tenderness.     Hernia: There is no hernia in the left inguinal area.  Genitourinary:    Penis: Normal.      Testes: Normal.  Musculoskeletal:        General: Normal range  of motion.     Cervical back: Normal range of motion.  Lymphadenopathy:     Cervical: No cervical adenopathy.  Skin:    General: Skin is warm and dry.     Findings: Lesion (  The right large toenail is distorted from onychomycosis ) present.  Neurological:     Mental Status: He is alert and oriented to person, place, and time.       Assessment & Plan:   Brookes was seen today for annual exam.  Diagnoses and all orders for this visit:  Well adult exam -     CBC with Differential/Platelet -     CMP14+EGFR -     Lipid panel -     Urinalysis  Non-seasonal allergic  rhinitis, unspecified trigger -     fexofenadine-pseudoephedrine (ALLEGRA-D 24) 180-240 MG 24 hr tablet; Take 1 tablet by mouth every evening. For allergy and congestion -     CBC with Differential/Platelet -     CMP14+EGFR  Migraine without aura and without status migrainosus, not intractable -     rizatriptan (MAXALT-MLT) 10 MG disintegrating tablet; Take 1 tablet (10 mg total) by mouth as needed for migraine. May repeat in 2 hours if needed -     cyclobenzaprine (FLEXERIL) 10 MG tablet; Take 1 tablet (10 mg total) by mouth 3 (three) times daily as needed for muscle spasms. -     CBC with Differential/Platelet -     CMP14+EGFR  Loud snoring -     Ambulatory referral to ENT -     CBC with Differential/Platelet -     CMP14+EGFR  Rosacea -     CBC with Differential/Platelet -     CMP14+EGFR  Vitamin D deficiency -     CBC with Differential/Platelet -     CMP14+EGFR -     VITAMIN D 25 Hydroxy (Vit-D Deficiency, Fractures)  Onychomycosis -     Ambulatory referral to Podiatry -     CBC with Differential/Platelet -     CMP14+EGFR  Other orders -     metroNIDAZOLE (METROGEL) 1 % gel; Apply topically daily. For roscea       I have discontinued Tanay Buczek's amoxicillin-clavulanate. I am also having him maintain his fluticasone, Vitamin D (Ergocalciferol), fexofenadine-pseudoephedrine, metroNIDAZOLE, rizatriptan, and cyclobenzaprine.  Allergies as of 08/24/2019   No Known Allergies     Medication List       Accurate as of August 24, 2019  9:57 PM. If you have any questions, ask your nurse or doctor.        STOP taking these medications   amoxicillin-clavulanate 875-125 MG tablet Commonly known as: AUGMENTIN Stopped by: Claretta Fraise, MD     TAKE these medications   cyclobenzaprine 10 MG tablet Commonly known as: FLEXERIL Take 1 tablet (10 mg total) by mouth 3 (three) times daily as needed for muscle spasms.   fexofenadine-pseudoephedrine 180-240 MG 24 hr  tablet Commonly known as: ALLEGRA-D 24 Take 1 tablet by mouth every evening. For allergy and congestion   fluticasone 50 MCG/ACT nasal spray Commonly known as: FLONASE SPRAY 2 SPRAYS INTO EACH NOSTRIL EVERY DAY   metroNIDAZOLE 1 % gel Commonly known as: Metrogel Apply topically daily. For roscea   rizatriptan 10 MG disintegrating tablet Commonly known as: MAXALT-MLT Take 1 tablet (10 mg total) by mouth as needed for migraine. May repeat in 2 hours if needed   Vitamin D (Ergocalciferol) 1.25 MG (50000 UNIT) Caps capsule Commonly known as: DRISDOL  Take 1 capsule (50,000 Units total) by mouth every 7 (seven) days.        Follow-up: No follow-ups on file.  Claretta Fraise, M.D.

## 2019-08-25 LAB — CBC WITH DIFFERENTIAL/PLATELET
Basophils Absolute: 0 10*3/uL (ref 0.0–0.2)
Basos: 1 %
EOS (ABSOLUTE): 0.1 10*3/uL (ref 0.0–0.4)
Eos: 1 %
Hematocrit: 43.4 % (ref 37.5–51.0)
Hemoglobin: 15.3 g/dL (ref 13.0–17.7)
Immature Grans (Abs): 0 10*3/uL (ref 0.0–0.1)
Immature Granulocytes: 0 %
Lymphocytes Absolute: 1.7 10*3/uL (ref 0.7–3.1)
Lymphs: 36 %
MCH: 30.7 pg (ref 26.6–33.0)
MCHC: 35.3 g/dL (ref 31.5–35.7)
MCV: 87 fL (ref 79–97)
Monocytes Absolute: 0.5 10*3/uL (ref 0.1–0.9)
Monocytes: 10 %
Neutrophils Absolute: 2.5 10*3/uL (ref 1.4–7.0)
Neutrophils: 52 %
Platelets: 297 10*3/uL (ref 150–450)
RBC: 4.99 x10E6/uL (ref 4.14–5.80)
RDW: 11.4 % — ABNORMAL LOW (ref 11.6–15.4)
WBC: 4.8 10*3/uL (ref 3.4–10.8)

## 2019-08-25 LAB — CMP14+EGFR
ALT: 31 IU/L (ref 0–44)
AST: 21 IU/L (ref 0–40)
Albumin/Globulin Ratio: 1.8 (ref 1.2–2.2)
Albumin: 4.3 g/dL (ref 4.0–5.0)
Alkaline Phosphatase: 83 IU/L (ref 48–121)
BUN/Creatinine Ratio: 17 (ref 9–20)
BUN: 16 mg/dL (ref 6–20)
Bilirubin Total: 0.3 mg/dL (ref 0.0–1.2)
CO2: 25 mmol/L (ref 20–29)
Calcium: 9 mg/dL (ref 8.7–10.2)
Chloride: 105 mmol/L (ref 96–106)
Creatinine, Ser: 0.95 mg/dL (ref 0.76–1.27)
GFR calc Af Amer: 119 mL/min/{1.73_m2} (ref >59)
GFR calc non Af Amer: 103 mL/min/{1.73_m2} (ref >59)
Globulin, Total: 2.4 g/dL (ref 1.5–4.5)
Glucose: 98 mg/dL (ref 65–99)
Potassium: 4.2 mmol/L (ref 3.5–5.2)
Sodium: 142 mmol/L (ref 134–144)
Total Protein: 6.7 g/dL (ref 6.0–8.5)

## 2019-08-25 LAB — VITAMIN D 25 HYDROXY (VIT D DEFICIENCY, FRACTURES): Vit D, 25-Hydroxy: 44.5 ng/mL (ref 30.0–100.0)

## 2019-08-25 LAB — LIPID PANEL
Chol/HDL Ratio: 3.6 ratio (ref 0.0–5.0)
Cholesterol, Total: 162 mg/dL (ref 100–199)
HDL: 45 mg/dL (ref >39)
LDL Chol Calc (NIH): 100 mg/dL — ABNORMAL HIGH (ref 0–99)
Triglycerides: 93 mg/dL (ref 0–149)
VLDL Cholesterol Cal: 17 mg/dL (ref 5–40)

## 2019-08-25 NOTE — Progress Notes (Signed)
Hello Greig,  Your lab result is normal and/or stable.Some minor variations that are not significant are commonly marked abnormal, but do not represent any medical problem for you.  Best regards, Senetra Dillin, M.D.

## 2019-09-22 ENCOUNTER — Encounter: Payer: Self-pay | Admitting: Family Medicine

## 2019-09-22 MED ORDER — VITAMIN D (ERGOCALCIFEROL) 1.25 MG (50000 UNIT) PO CAPS: 50000.0000 [IU] | ORAL_CAPSULE | ORAL | 1 refills | Status: DC

## 2019-09-22 MED FILL — VIT D2 1.25 MG (50,000 UNIT: 1.25 MG | 90 days supply | Qty: 13 | Fill #0

## 2019-10-05 DIAGNOSIS — M79676 Pain in unspecified toe(s): Secondary | ICD-10-CM | POA: Diagnosis not present

## 2019-10-05 DIAGNOSIS — B351 Tinea unguium: Secondary | ICD-10-CM | POA: Diagnosis not present

## 2019-10-06 DIAGNOSIS — J343 Hypertrophy of nasal turbinates: Secondary | ICD-10-CM | POA: Diagnosis not present

## 2019-10-06 DIAGNOSIS — J342 Deviated nasal septum: Secondary | ICD-10-CM | POA: Diagnosis not present

## 2019-10-06 DIAGNOSIS — J310 Chronic rhinitis: Secondary | ICD-10-CM | POA: Diagnosis not present

## 2019-10-06 MED FILL — FLUTICASONE PROP 50 MCG SPR: 50 | 30 days supply | Qty: 16 | Fill #0

## 2019-10-15 ENCOUNTER — Telehealth: Payer: 59 | Admitting: Nurse Practitioner

## 2019-10-15 DIAGNOSIS — B001 Herpesviral vesicular dermatitis: Secondary | ICD-10-CM

## 2019-10-15 MED ORDER — VALACYCLOVIR HCL 1 G PO TABS
ORAL_TABLET | ORAL | 0 refills | Status: DC
Start: 2019-10-15 — End: 2019-12-13

## 2019-10-15 NOTE — Progress Notes (Signed)
We are sorry that you are not feeling well.  Here is how we plan to help!  Based on what you have shared with me it does look like you have a viral infection.    Most cold sores or fever blisters are small fluid filled blisters around the mouth caused by herpes simplex virus.  The most common strain of the virus causing cold sores is herpes simplex virus 1.  It can be spread by skin contact, sharing eating utensils, or even sharing towels.  Cold sores are contagious to other people until dry. (Approximately 5-7 days).  Wash your hands. You can spread the virus to your eyes through handling your contact lenses after touching the lesions.  Most people experience pain at the sight or tingling sensations in their lips that may begin before the ulcers erupt.  Herpes simplex is treatable but not curable.  It may lie dormant for a long time and then reappear due to stress or prolonged sun exposure.  Many patients have success in treating their cold sores with an over the counter topical called Abreva.  You may apply the cream up to 5 times daily (maximum 10 days) until healing occurs.  If you would like to use an oral antiviral medication to speed the healing of your cold sore, I have sent a prescription to your local pharmacy Valacyclovir 2 gm take one by mouth twice a day for 1 day    HOME CARE:  Wash your hands frequently. Do not pick at or rub the sore. Don't open the blisters. Avoid kissing other people during this time. Avoid sharing drinking glasses, eating utensils, or razors. Do not handle contact lenses unless you have thoroughly washed your hands with soap and warm water! Avoid oral sex during this time.  Herpes from sores on your mouth can spread to your partner's genital area. Avoid contact with anyone who has eczema or a weakened immune system. Cold sores are often triggered by exposure to intense sunlight, use a lip balm containing a sunscreen (SPF 30 or higher).  GET HELP RIGHT AWAY  IF:  Blisters look infected. Blisters occur near or in the eye. Symptoms last longer than 10 days. Your symptoms become worse.  MAKE SURE YOU:  Understand these instructions. Will watch your condition. Will get help right away if you are not doing well or get worse.    Your e-visit answers were reviewed by a board certified advanced clinical practitioner to complete your personal care plan.  Depending upon the condition, your plan could have  Included both over the counter or prescription medications.    Please review your pharmacy choice.  Be sure that the pharmacy you have chosen is open so that you can pick up your prescription now.  If there is a problem you can message your provider in MyChart to have the prescription routed to another pharmacy.    Your safety is important to us.  If you have drug allergies check our prescription carefully.  For the next 24 hours you can use MyChart to ask questions about today's visit, request a non-urgent call back, or ask for a work or school excuse from your e-visit provider.  You will get an email in the next two days asking about your experience.  I hope that your e-visit has been valuable and will speed your recovery.  5-10 minutes spent reviewing and documenting in chart.   

## 2019-10-16 DIAGNOSIS — A499 Bacterial infection, unspecified: Secondary | ICD-10-CM | POA: Diagnosis not present

## 2019-10-18 DIAGNOSIS — A499 Bacterial infection, unspecified: Secondary | ICD-10-CM | POA: Diagnosis not present

## 2019-11-05 MED FILL — CYCLOBENZAPRINE HCL 10 MG T: 10 | 10 days supply | Qty: 30 | Fill #1

## 2019-11-05 MED FILL — RIZATRIPTAN 10 MG ODT: 10 | 30 days supply | Qty: 9 | Fill #0

## 2019-11-17 DIAGNOSIS — J342 Deviated nasal septum: Secondary | ICD-10-CM | POA: Diagnosis not present

## 2019-11-17 DIAGNOSIS — J310 Chronic rhinitis: Secondary | ICD-10-CM | POA: Diagnosis not present

## 2019-11-17 DIAGNOSIS — J343 Hypertrophy of nasal turbinates: Secondary | ICD-10-CM | POA: Diagnosis not present

## 2019-11-23 ENCOUNTER — Other Ambulatory Visit: Payer: Self-pay | Admitting: Otolaryngology

## 2019-12-13 ENCOUNTER — Encounter (HOSPITAL_BASED_OUTPATIENT_CLINIC_OR_DEPARTMENT_OTHER): Payer: Self-pay | Admitting: Otolaryngology

## 2019-12-13 ENCOUNTER — Other Ambulatory Visit: Payer: Self-pay

## 2019-12-14 ENCOUNTER — Other Ambulatory Visit (HOSPITAL_COMMUNITY)
Admission: RE | Admit: 2019-12-14 | Discharge: 2019-12-14 | Disposition: A | Discharge status: Home / Self Care | Payer: 59 | Source: Ambulatory Visit | Attending: Otolaryngology | Admitting: Otolaryngology

## 2019-12-14 DIAGNOSIS — Z20822 Contact with and (suspected) exposure to covid-19: Secondary | ICD-10-CM | POA: Insufficient documentation

## 2019-12-14 DIAGNOSIS — Z01812 Encounter for preprocedural laboratory examination: Secondary | ICD-10-CM | POA: Diagnosis not present

## 2019-12-14 LAB — SARS CORONAVIRUS 2 (TAT 6-24 HRS): SARS Coronavirus 2: NEGATIVE

## 2019-12-17 ENCOUNTER — Ambulatory Visit (HOSPITAL_BASED_OUTPATIENT_CLINIC_OR_DEPARTMENT_OTHER): Payer: 59 | Admitting: Anesthesiology

## 2019-12-17 ENCOUNTER — Ambulatory Visit (HOSPITAL_BASED_OUTPATIENT_CLINIC_OR_DEPARTMENT_OTHER)
Admission: RE | Admit: 2019-12-17 | Discharge: 2019-12-17 | Disposition: A | Discharge status: Home / Self Care | Payer: 59 | Attending: Otolaryngology | Admitting: Otolaryngology

## 2019-12-17 ENCOUNTER — Other Ambulatory Visit (HOSPITAL_BASED_OUTPATIENT_CLINIC_OR_DEPARTMENT_OTHER): Payer: Self-pay | Admitting: Otolaryngology

## 2019-12-17 ENCOUNTER — Encounter (HOSPITAL_BASED_OUTPATIENT_CLINIC_OR_DEPARTMENT_OTHER): Payer: Self-pay | Admitting: Otolaryngology

## 2019-12-17 ENCOUNTER — Encounter (HOSPITAL_BASED_OUTPATIENT_CLINIC_OR_DEPARTMENT_OTHER)
Admission: RE | Disposition: A | Discharge status: Home / Self Care | Payer: Self-pay | Source: Home / Self Care | Attending: Otolaryngology

## 2019-12-17 ENCOUNTER — Other Ambulatory Visit: Payer: Self-pay

## 2019-12-17 DIAGNOSIS — G43009 Migraine without aura, not intractable, without status migrainosus: Secondary | ICD-10-CM | POA: Diagnosis not present

## 2019-12-17 DIAGNOSIS — J3489 Other specified disorders of nose and nasal sinuses: Secondary | ICD-10-CM | POA: Diagnosis not present

## 2019-12-17 DIAGNOSIS — J343 Hypertrophy of nasal turbinates: Secondary | ICD-10-CM | POA: Diagnosis not present

## 2019-12-17 DIAGNOSIS — J342 Deviated nasal septum: Secondary | ICD-10-CM | POA: Diagnosis not present

## 2019-12-17 HISTORY — DX: Allergy status to unspecified drugs, medicaments and biological substances: Z88.9

## 2019-12-17 HISTORY — DX: Migraine, unspecified, not intractable, without status migrainosus: G43.909

## 2019-12-17 HISTORY — PX: NASAL SEPTOPLASTY W/ TURBINOPLASTY: SHX2070

## 2019-12-17 SURGERY — SEPTOPLASTY, NOSE, WITH NASAL TURBINATE REDUCTION
Anesthesia: General | Site: Nose | Laterality: Bilateral

## 2019-12-17 MED ORDER — OXYCODONE HCL 5 MG PO TABS
ORAL_TABLET | ORAL | Status: AC
  Filled 2019-12-17: qty 1

## 2019-12-17 MED ORDER — ACETAMINOPHEN 10 MG/ML IV SOLN: 1000.0000 mg | Freq: Once | INTRAVENOUS | Status: DC | PRN

## 2019-12-17 MED ORDER — FENTANYL CITRATE (PF) 100 MCG/2ML IJ SOLN
INTRAMUSCULAR | Status: AC
  Filled 2019-12-17: qty 2

## 2019-12-17 MED ORDER — ONDANSETRON HCL 4 MG/2ML IJ SOLN
INTRAMUSCULAR | Status: DC | PRN
  Administered 2019-12-17: 4 mg via INTRAVENOUS

## 2019-12-17 MED ORDER — LIDOCAINE 2% (20 MG/ML) 5 ML SYRINGE
INTRAMUSCULAR | Status: AC
  Filled 2019-12-17: qty 5

## 2019-12-17 MED ORDER — LIDOCAINE-EPINEPHRINE 1 %-1:100000 IJ SOLN
INTRAMUSCULAR | Status: DC | PRN
  Administered 2019-12-17: 2 mL

## 2019-12-17 MED ORDER — PROPOFOL 10 MG/ML IV BOLUS
INTRAVENOUS | Status: AC
  Filled 2019-12-17: qty 20

## 2019-12-17 MED ORDER — OXYMETAZOLINE HCL 0.05 % NA SOLN
NASAL | Status: DC | PRN
  Administered 2019-12-17: 1 via TOPICAL

## 2019-12-17 MED ORDER — ROCURONIUM BROMIDE 10 MG/ML (PF) SYRINGE
PREFILLED_SYRINGE | INTRAVENOUS | Status: AC
  Filled 2019-12-17: qty 10

## 2019-12-17 MED ORDER — LIDOCAINE HCL (CARDIAC) PF 100 MG/5ML IV SOSY
PREFILLED_SYRINGE | INTRAVENOUS | Status: DC | PRN
  Administered 2019-12-17: 100 mg via INTRAVENOUS

## 2019-12-17 MED ORDER — OXYMETAZOLINE HCL 0.05 % NA SOLN
NASAL | Status: AC
  Filled 2019-12-17: qty 30

## 2019-12-17 MED ORDER — MUPIROCIN 2 % EX OINT
TOPICAL_OINTMENT | CUTANEOUS | Status: DC | PRN
  Administered 2019-12-17: 1 via NASAL

## 2019-12-17 MED ORDER — COCAINE HCL 4 % EX SOLN
CUTANEOUS | Status: AC
  Filled 2019-12-17: qty 4

## 2019-12-17 MED ORDER — AMISULPRIDE (ANTIEMETIC) 5 MG/2ML IV SOLN: 10.0000 mg | Freq: Once | INTRAVENOUS | Status: DC | PRN

## 2019-12-17 MED ORDER — LIDOCAINE-EPINEPHRINE 1 %-1:100000 IJ SOLN
INTRAMUSCULAR | Status: AC
  Filled 2019-12-17: qty 1

## 2019-12-17 MED ORDER — FENTANYL CITRATE (PF) 100 MCG/2ML IJ SOLN
INTRAMUSCULAR | Status: DC | PRN
Start: 2019-12-17 — End: 2019-12-17
  Administered 2019-12-17: 25 ug via INTRAVENOUS
  Administered 2019-12-17: 100 ug via INTRAVENOUS

## 2019-12-17 MED ORDER — AMOXICILLIN 875 MG PO TABS: 875.0000 mg | ORAL_TABLET | Freq: Two times a day (BID) | ORAL | 0 refills | Status: DC

## 2019-12-17 MED ORDER — MIDAZOLAM HCL 5 MG/5ML IJ SOLN
INTRAMUSCULAR | Status: DC | PRN
  Administered 2019-12-17: 2 mg via INTRAVENOUS

## 2019-12-17 MED ORDER — PROPOFOL 10 MG/ML IV BOLUS
INTRAVENOUS | Status: DC | PRN
  Administered 2019-12-17: 200 mg via INTRAVENOUS

## 2019-12-17 MED ORDER — LACTATED RINGERS IV SOLN: INTRAVENOUS | Status: DC

## 2019-12-17 MED ORDER — MEPERIDINE HCL 25 MG/ML IJ SOLN: 6.2500 mg | INTRAMUSCULAR | Status: DC | PRN

## 2019-12-17 MED ORDER — MIDAZOLAM HCL 2 MG/2ML IJ SOLN
INTRAMUSCULAR | Status: AC
  Filled 2019-12-17: qty 2

## 2019-12-17 MED ORDER — SCOPOLAMINE 1 MG/3DAYS TD PT72: 1.0000 | MEDICATED_PATCH | TRANSDERMAL | Status: DC

## 2019-12-17 MED ORDER — SUGAMMADEX SODIUM 200 MG/2ML IV SOLN
INTRAVENOUS | Status: DC | PRN
  Administered 2019-12-17: 200 mg via INTRAVENOUS

## 2019-12-17 MED ORDER — MUPIROCIN 2 % EX OINT
TOPICAL_OINTMENT | CUTANEOUS | Status: AC
  Filled 2019-12-17: qty 22

## 2019-12-17 MED ORDER — OXYCODONE-ACETAMINOPHEN 5-325 MG PO TABS: 1.0000 | ORAL_TABLET | ORAL | 0 refills | Status: AC | PRN

## 2019-12-17 MED ORDER — ACETAMINOPHEN 160 MG/5ML PO SOLN: 325.0000 mg | Freq: Once | ORAL | Status: DC | PRN

## 2019-12-17 MED ORDER — ROCURONIUM BROMIDE 100 MG/10ML IV SOLN
INTRAVENOUS | Status: DC | PRN
  Administered 2019-12-17: 50 mg via INTRAVENOUS

## 2019-12-17 MED ORDER — COCAINE HCL 4 % EX SOLN
CUTANEOUS | Status: DC | PRN
  Administered 2019-12-17: 4 mL via NASAL

## 2019-12-17 MED ORDER — OXYCODONE HCL 5 MG PO TABS
5.0000 mg | ORAL_TABLET | Freq: Once | ORAL | Status: AC | PRN
  Administered 2019-12-17: 5 mg via ORAL

## 2019-12-17 MED ORDER — DEXAMETHASONE SODIUM PHOSPHATE 4 MG/ML IJ SOLN
INTRAMUSCULAR | Status: DC | PRN
  Administered 2019-12-17: 10 mg via INTRAVENOUS

## 2019-12-17 MED ORDER — OXYCODONE HCL 5 MG/5ML PO SOLN: 5.0000 mg | Freq: Once | ORAL | Status: AC | PRN

## 2019-12-17 MED ORDER — ACETAMINOPHEN 325 MG PO TABS: 325.0000 mg | ORAL_TABLET | Freq: Once | ORAL | Status: DC | PRN

## 2019-12-17 MED ORDER — HYDROMORPHONE HCL 1 MG/ML IJ SOLN: 0.2500 mg | INTRAMUSCULAR | Status: DC | PRN

## 2019-12-17 MED FILL — OXYCODONE-APAP 5-325MG: 5-325 | 3 days supply | Qty: 18 | Fill #0

## 2019-12-17 MED FILL — AMOXICILLIN 875 MG TABS: 875 | 3 days supply | Qty: 6 | Fill #0

## 2019-12-17 SURGICAL SUPPLY — 33 items
ATTRACTOMAT 16X20 MAGNETIC DRP (DRAPES) IMPLANT
CANISTER SUCT 1200ML W/VALVE (MISCELLANEOUS) ×2 IMPLANT
COAGULATOR SUCT 8FR VV (MISCELLANEOUS) ×2 IMPLANT
COVER WAND RF STERILE (DRAPES) IMPLANT
DECANTER SPIKE VIAL GLASS SM (MISCELLANEOUS) IMPLANT
DRSG NASOPORE 8CM (GAUZE/BANDAGES/DRESSINGS) ×2 IMPLANT
DRSG TELFA 3X8 NADH (GAUZE/BANDAGES/DRESSINGS) IMPLANT
ELECT REM PT RETURN 9FT ADLT (ELECTROSURGICAL) ×2
ELECTRODE REM PT RTRN 9FT ADLT (ELECTROSURGICAL) ×1 IMPLANT
GLOVE BIO SURGEON STRL SZ7.5 (GLOVE) ×2 IMPLANT
GLOVE BIOGEL PI IND STRL 7.0 (GLOVE) ×1 IMPLANT
GLOVE BIOGEL PI INDICATOR 7.0 (GLOVE) ×1
GLOVE ECLIPSE 7.0 STRL STRAW (GLOVE) ×2 IMPLANT
GOWN STRL REUS W/ TWL LRG LVL3 (GOWN DISPOSABLE) ×2 IMPLANT
GOWN STRL REUS W/TWL LRG LVL3 (GOWN DISPOSABLE) ×4
NEEDLE HYPO 25X1 1.5 SAFETY (NEEDLE) ×2 IMPLANT
NS IRRIG 1000ML POUR BTL (IV SOLUTION) ×2 IMPLANT
PACK BASIN DAY SURGERY FS (CUSTOM PROCEDURE TRAY) ×2 IMPLANT
PACK ENT DAY SURGERY (CUSTOM PROCEDURE TRAY) ×2 IMPLANT
SLEEVE SCD COMPRESS KNEE MED (MISCELLANEOUS) ×2 IMPLANT
SOLUTION BUTLER CLEAR DIP (MISCELLANEOUS) ×2 IMPLANT
SPLINT NASAL AIRWAY SILICONE (MISCELLANEOUS) ×2 IMPLANT
SPONGE GAUZE 2X2 8PLY STRL LF (GAUZE/BANDAGES/DRESSINGS) ×2 IMPLANT
SPONGE NEURO XRAY DETECT 1X3 (DISPOSABLE) ×4 IMPLANT
SUT CHROMIC 4 0 P 3 18 (SUTURE) ×2 IMPLANT
SUT PLAIN 4 0 ~~LOC~~ 1 (SUTURE) ×2 IMPLANT
SUT PROLENE 3 0 PS 2 (SUTURE) ×2 IMPLANT
SUT VIC AB 4-0 P-3 18XBRD (SUTURE) IMPLANT
SUT VIC AB 4-0 P3 18 (SUTURE)
TOWEL GREEN STERILE FF (TOWEL DISPOSABLE) ×2 IMPLANT
TUBE SALEM SUMP 12R W/ARV (TUBING) IMPLANT
TUBE SALEM SUMP 16 FR W/ARV (TUBING) ×2 IMPLANT
YANKAUER SUCT BULB TIP NO VENT (SUCTIONS) ×2 IMPLANT

## 2019-12-17 NOTE — Anesthesia Preprocedure Evaluation (Addendum)
Anesthesia Evaluation  Patient identified by MRN, date of birth, ID band Patient awake    Reviewed: Allergy & Precautions, NPO status , Patient's Chart, lab work & pertinent test results  Airway Mallampati: II  TM Distance: >3 FB Neck ROM: Full    Dental  (+) Teeth Intact, Dental Advisory Given   Pulmonary neg pulmonary ROS,    breath sounds clear to auscultation       Cardiovascular negative cardio ROS   Rhythm:Regular Rate:Normal     Neuro/Psych  Headaches, negative psych ROS   GI/Hepatic negative GI ROS, Neg liver ROS,   Endo/Other  negative endocrine ROS  Renal/GU negative Renal ROS     Musculoskeletal negative musculoskeletal ROS (+)   Abdominal Normal abdominal exam  (+)   Peds  Hematology negative hematology ROS (+)   Anesthesia Other Findings   Reproductive/Obstetrics                            Anesthesia Physical Anesthesia Plan  ASA: II  Anesthesia Plan: General   Post-op Pain Management:    Induction: Intravenous  PONV Risk Score and Plan: 3 and Ondansetron, Dexamethasone, Midazolam, Propofol infusion and Scopolamine patch - Pre-op  Airway Management Planned: Oral ETT  Additional Equipment: None  Intra-op Plan:   Post-operative Plan: Extubation in OR  Informed Consent: I have reviewed the patients History and Physical, chart, labs and discussed the procedure including the risks, benefits and alternatives for the proposed anesthesia with the patient or authorized representative who has indicated his/her understanding and acceptance.     Dental advisory given  Plan Discussed with: CRNA  Anesthesia Plan Comments:        Anesthesia Quick Evaluation

## 2019-12-17 NOTE — Transfer of Care (Signed)
Immediate Anesthesia Transfer of Care Note  Patient: Kevin Hughes  Procedure(s) Performed: NASAL SEPTOPLASTY WITH TURBINATE REDUCTION (Bilateral Nose)  Patient Location: PACU  Anesthesia Type:General  Level of Consciousness: awake, alert  and oriented  Airway & Oxygen Therapy: Patient Spontanous Breathing and Patient connected to face mask oxygen  Post-op Assessment: Report given to RN and Post -op Vital signs reviewed and stable  Post vital signs: Reviewed and stable  Last Vitals:  Vitals Value Taken Time  BP    Temp    Pulse 74 12/17/19 1153  Resp    SpO2 96 % 12/17/19 1153  Vitals shown include unvalidated device data.  Last Pain:  Vitals:   12/17/19 0816  TempSrc: Oral  PainSc: 0-No pain      Patients Stated Pain Goal: 1 (12/17/19 0816)  Complications: No complications documented.

## 2019-12-17 NOTE — Discharge Instructions (Addendum)
POSTOPERATIVE INSTRUCTIONS FOR PATIENTS HAVING NASAL OR SINUS OPERATIONS ACTIVITY: Restrict activity at home for the first two days, resting as much as possible. Light activity is best. You may usually return to work within a week. You should refrain from nose blowing, strenuous activity, or heavy lifting greater than 20lbs for a total of one week after your operation.  If sneezing cannot be avoided, sneeze with your mouth open. DISCOMFORT: You may experience a dull headache and pressure along with nasal congestion and discharge. These symptoms may be worse during the first week after the operation but may last as long as two to four weeks.  Please take Tylenol or the pain medication that has been prescribed for you. Do not take aspirin or aspirin containing medications since they may cause bleeding.  You may experience symptoms of post nasal drainage, nasal congestion, headaches and fatigue for two or three months after your operation.  BLEEDING: You may have some blood tinged nasal drainage for approximately two weeks after the operation.  The discharge will be worse for the first week.  Please call our office at [phone] or go to the nearest hospital emergency room if you experience any of the following: heavy, bright red blood from your nose or mouth that lasts longer than 15 minutes or coughing up or vomiting bright red blood or blood clots. GENERAL CONSIDERATIONS: 1. A gauze dressing will be placed on your upper lip to absorb any drainage after the operation. You may need to change this several times a day.  If you do not have very much drainage, you may remove the dressing.  Remember that you may gently wipe your nose with a tissue and sniff in, but DO NOT blow your nose. 2. Please keep all of your postoperative appointments.  Your final results after the operation will depend on proper follow-up.  The initial visit is usually 2 to 5 days after the operation.  During this visit, the remaining nasal  packing and internal septal splints will be removed.  Your nasal and sinus cavities will be cleaned.  During the second visit, your nasal and sinus cavities will be cleaned again. Have someone drive you to your first two postoperative appointments.  3. How you care for your nose after the operation will influence the results that you obtain.  You should follow all directions, take your medication as prescribed, and call our office [phone] with any problems or questions. 4. You may be more comfortable sleeping with your head elevated on two pillows. 5. Do not take any medications that we have not prescribed or recommended. WARNING SIGNS: if any of the following should occur, please call our office: 1. Persistent fever greater than 102F. 2. Persistent vomiting. 3. Severe and constant pain that is not relieved by prescribed pain medication. 4. Trauma to the nose. 5. Rash or unusual side effects from any medicines.    Post Anesthesia Home Care Instructions  Activity: Get plenty of rest for the remainder of the day. A responsible individual must stay with you for 24 hours following the procedure.  For the next 24 hours, DO NOT: -Drive a car -Paediatric nurse -Drink alcoholic beverages -Take any medication unless instructed by your physician -Make any legal decisions or sign important papers.  Meals: Start with liquid foods such as gelatin or soup. Progress to regular foods as tolerated. Avoid greasy, spicy, heavy foods. If nausea and/or vomiting occur, drink only clear liquids until the nausea and/or vomiting subsides. Call your physician if  vomiting continues.  Special Instructions/Symptoms: Your throat may feel dry or sore from the anesthesia or the breathing tube placed in your throat during surgery. If this causes discomfort, gargle with warm salt water. The discomfort should disappear within 24 hours.  If you had a scopolamine patch placed behind your ear for the management of  post- operative nausea and/or vomiting:  1. The medication in the patch is effective for 72 hours, after which it should be removed.  Wrap patch in a tissue and discard in the trash. Wash hands thoroughly with soap and water. 2. You may remove the patch earlier than 72 hours if you experience unpleasant side effects which may include dry mouth, dizziness or visual disturbances. 3. Avoid touching the patch. Wash your hands with soap and water after contact with the patch.    No Percocet until 5:30 pm as needed.

## 2019-12-17 NOTE — Op Note (Signed)
DATE OF PROCEDURE: 12/17/2019  OPERATIVE REPORT   SURGEON: Newman Pies, MD   PREOPERATIVE DIAGNOSES:  1. Severe nasal septal deviation.  2. Bilateral inferior turbinate hypertrophy.  3. Chronic nasal obstruction.  POSTOPERATIVE DIAGNOSES:  1. Severe nasal septal deviation.  2. Bilateral inferior turbinate hypertrophy.  3. Chronic nasal obstruction.  PROCEDURE PERFORMED:  1. Septoplasty.  2. Bilateral partial inferior turbinate resection.   ANESTHESIA: General endotracheal tube anesthesia.   COMPLICATIONS: None.   ESTIMATED BLOOD LOSS: 150 mL  INDICATION FOR PROCEDURE: Kevin Hughes is a 34 y.o. male with a history of chronic nasal obstruction. The patient was treated with antihistamine, decongestant, and steroid nasal sprays. However, the patient continued to be symptomatic. On examination, the patient was noted to have bilateral severe inferior turbinate hypertrophy and significant nasal septal deviation, causing significant nasal obstruction. Based on the above findings, the decision was made for the patient to undergo the above-stated procedures. The risks, benefits, alternatives, and details of the procedures were discussed with the patient. Questions were invited and answered. Informed consent was obtained.   DESCRIPTION OF PROCEDURE: The patient was taken to the operating room and placed supine on the operating table. General endotracheal tube anesthesia was administered by the anesthesiologist. The patient was positioned, and prepped and draped in the standard fashion for nasal surgery. Pledgets soaked with Afrin were placed in both nasal cavities for decongestion. The pledgets were subsequently removed.   Examination of the nasal cavity revealed a severe nasal septal deviation. 1% lidocaine with 1:100,000 epinephrine was injected onto the nasal septum bilaterally. A hemitransfixion incision was made on the left side. The mucosal flap was carefully elevated on the left side. A  cartilaginous incision was made 1 cm superior to the caudal margin of the nasal septum. Mucosal flap was also elevated on the right side in the similar fashion. It should be noted that due to the severe septal deviation, the deviated portion of the cartilaginous and bony septum had to be removed in piecemeal fashion. Once the deviated portions were removed, a straight midline septum was achieved. The septum was then quilted with 4-0 plain gut sutures. The hemitransfixion incision was closed with interrupted 4-0 chromic sutures.   The inferior one half of both hypertrophied inferior turbinate was crossclamped with a Kelly clamp. The inferior one half of each inferior turbinate was then resected with a pair of cross cutting scissors. Hemostasis was achieved with a suction cautery device. The same procedure was repeated on the right side without exception.  Doyle splints were applied to the nasal septum.  The care of the patient was turned over to the anesthesiologist. The patient was awakened from anesthesia without difficulty. The patient was extubated and transferred to the recovery room in good condition.   OPERATIVE FINDINGS: Severe nasal septal deviation and bilateral inferior turbinate hypertrophy.   SPECIMEN: None.   FOLLOWUP CARE: The patient be discharged home once he is awake and alert. The patient will be placed on Percocet 1 tablets p.o. q.4 hours p.r.n. pain, and amoxicillin 875 mg p.o. b.i.d. for 3 days. The patient will follow up in my office in 3 days for splint removal.   Briunna Leicht Philomena Doheny, MD

## 2019-12-17 NOTE — H&P (Signed)
Cc: Chronic nasal obstruction  HPI: The patient is a 34 y/o male who returns today with his wife for follow up evaluation of snoring and nasal congestion. The patient was last seen 6 weeks ago. At that time, he was noted to have severe nasal congestion with a significant septal deviation and turbinate hypertrophy. This resulted in significant nasal obstruction. The patient was placed on daily Flonase. The use of an oral appliance was discussed to help with his snoring. The patient returns today noting no significant improvement in his nasal congestion or snoring. He recently bought a mouthpiece for his snoring but is still trying to get used to it. The patient had a sleep study earlier this year which was negative for sleep apnea.   Exam: The nasal cavities were decongested and anesthetised with a combination of oxymetazoline and 4% lidocaine solution. The flexible scope was inserted into the right nasal cavity. Severe NSD. Endoscopy of the interior nasal cavity, superior, inferior, and middle meatus was performed. The sphenoid-ethmoid recess was examined. Edematous mucosa was noted. No polyp, mass, or lesion was appreciated. Olfactory cleft was clear. Nasopharynx was clear. Turbinates were hypertrophied but without mass. Incomplete response to decongestion. The procedure was repeated on the contralateral side with similar findings. The patient tolerated the procedure well. Instructions were given to avoid eating or drinking for 2 hours.  Assessment: 1. Severe septal deviation was noted with bilateral inferior turbinate hypertrophy. This results in >95% nasal obstruction bilaterally.  No purulent drainage, polyps, or other suspicious mass or lesion is noted on today's nasal endoscopy. 2. Primary snoring disorder.  Plan:   1. The physical exam and nasal endoscopy findings are reviewed with the patient and his wife.  2. Treatment options include conservative management with daily steroid nasal spray  versus septoplasty and bilateral inferior turbinate reduction. The risks, benefits, alternatives, and details of the procedure are reviewed with the patient. Questions are invited and answered. 3. The patient would like to proceed with surgical intervention. The procedure will be scheduled in accordance with the patient's schedule.

## 2019-12-17 NOTE — Anesthesia Postprocedure Evaluation (Signed)
Anesthesia Post Note  Patient: Kevin Hughes  Procedure(s) Performed: NASAL SEPTOPLASTY WITH TURBINATE REDUCTION (Bilateral Nose)     Patient location during evaluation: PACU Anesthesia Type: General Level of consciousness: awake and alert Pain management: pain level controlled Vital Signs Assessment: post-procedure vital signs reviewed and stable Respiratory status: spontaneous breathing, nonlabored ventilation, respiratory function stable and patient connected to nasal cannula oxygen Cardiovascular status: blood pressure returned to baseline and stable Postop Assessment: no apparent nausea or vomiting Anesthetic complications: no   No complications documented.  Last Vitals:  Vitals:   12/17/19 1234 12/17/19 1245  BP:  140/85  Pulse: (!) 59 66  Resp: 13 15  Temp:    SpO2: 97% 98%    Last Pain:  Vitals:   12/17/19 1245  TempSrc:   PainSc: Asleep                 Shelton Silvas

## 2019-12-20 ENCOUNTER — Encounter (HOSPITAL_BASED_OUTPATIENT_CLINIC_OR_DEPARTMENT_OTHER): Payer: Self-pay | Admitting: Otolaryngology

## 2020-01-17 MED FILL — VIT D2 1.25 MG (50,000 UNIT: 1.25 MG | 84 days supply | Qty: 12 | Fill #1

## 2020-02-23 MED FILL — CYCLOBENZAPRINE HCL 10 MG T: 10 | 10 days supply | Qty: 30 | Fill #2

## 2020-03-13 ENCOUNTER — Telehealth: Payer: 59 | Admitting: Nurse Practitioner

## 2020-03-13 DIAGNOSIS — L0293 Carbuncle, unspecified: Secondary | ICD-10-CM

## 2020-03-13 MED ORDER — CEPHALEXIN 500 MG PO CAPS: 500.0000 mg | ORAL_CAPSULE | Freq: Four times a day (QID) | ORAL | 0 refills | Status: DC

## 2020-03-13 NOTE — Progress Notes (Signed)

## 2020-04-03 ENCOUNTER — Telehealth: Payer: 59 | Admitting: Family

## 2020-04-03 DIAGNOSIS — U071 COVID-19: Secondary | ICD-10-CM

## 2020-04-03 DIAGNOSIS — Z20822 Contact with and (suspected) exposure to covid-19: Secondary | ICD-10-CM | POA: Diagnosis not present

## 2020-04-03 MED ORDER — ALBUTEROL SULFATE HFA 108 (90 BASE) MCG/ACT IN AERS: 2.0000 | INHALATION_SPRAY | Freq: Four times a day (QID) | RESPIRATORY_TRACT | 0 refills | Status: DC | PRN

## 2020-04-03 MED ORDER — BENZONATATE 100 MG PO CAPS: 100.0000 mg | ORAL_CAPSULE | Freq: Three times a day (TID) | ORAL | 0 refills | Status: DC | PRN

## 2020-04-03 NOTE — Progress Notes (Signed)
E-Visit for Corona Virus Screening  We are sorry you are not feeling well. We are here to help!  You have tested positive for COVID-19, meaning that you were infected with the novel coronavirus and could give the virus to others.  It is vitally important that you stay home so you do not spread it to others.      Please continue isolation at home, for at least 10 days since the start of your symptoms and until you have had 24 hours with no fever (without taking a fever reducer) and with improving of symptoms.  If you have no symptoms but tested positive (or all symptoms resolve after 5 days and you have no fever) you can leave your house but continue to wear a mask around others for an additional 5 days. If you have a fever,continue to stay home until you have had 24 hours of no fever. Most cases improve 5-10 days from onset but we have seen a small number of patients who have gotten worse after the 10 days.  Please be sure to watch for worsening symptoms and remain taking the proper precautions.   Go to the nearest hospital ED for assessment if fever/cough/breathlessness are severe or illness seems like a threat to life.    The following symptoms may appear 2-14 days after exposure: . Fever . Cough . Shortness of breath or difficulty breathing . Chills . Repeated shaking with chills . Muscle pain . Headache . Sore throat . New loss of taste or smell . Fatigue . Congestion or runny nose . Nausea or vomiting . Diarrhea  You have been enrolled in [facility] for COVID-19. Daily you will receive a questionnaire within the [facility] website. Our COVID-19 response team will be monitoring your responses daily.  You can use medication such as A prescription cough medication called Tessalon Perles 100 mg. You may take 1-2 capsules every 8 hours as needed for cough and A prescription inhaler called Albuterol MDI 90 mcg /actuation 2 puffs every 4 hours as needed for shortness of breath,  wheezing, cough     You may also take acetaminophen (Tylenol) as needed for fever.  HOME CARE: . Only take medications as instructed by your medical team. . Drink plenty of fluids and get plenty of rest. . A steam or ultrasonic humidifier can help if you have congestion.   GET HELP RIGHT AWAY IF YOU HAVE EMERGENCY WARNING SIGNS.  Call 911 or proceed to your closest emergency facility if: . You develop worsening high fever. . Trouble breathing . Bluish lips or face . Persistent pain or pressure in the chest . New confusion . Inability to wake or stay awake . You cough up blood. . Your symptoms become more severe . Inability to hold down food or fluids  This list is not all possible symptoms. Contact your medical provider for any symptoms that are severe or concerning to you.    Your e-visit answers were reviewed by a board certified advanced clinical practitioner to complete your personal care plan.  Depending on the condition, your plan could have included both over the counter or prescription medications.  If there is a problem please reply once you have received a response from your provider.  Your safety is important to [place].  If you have drug allergies check your prescription carefully.    You can use MyChart to ask questions about today's visit, request a non-urgent call back, or ask for a work or school  excuse for 24 hours related to this e-Visit. If it has been greater than 24 hours you will need to follow up with your provider, or enter a new e-Visit to address those concerns. You will get an e-mail in the next two days asking about your experience.  I hope that your e-visit has been valuable and will speed your recovery. Thank you for using e-visits.   Approximately 5 minutes was spent documenting and reviewing patient's chart.      

## 2020-04-10 MED FILL — CYCLOBENZAPRINE HCL 10 MG T: 10 | 10 days supply | Qty: 30 | Fill #3

## 2020-05-09 ENCOUNTER — Ambulatory Visit: Payer: 59 | Admitting: Nurse Practitioner

## 2020-05-09 ENCOUNTER — Other Ambulatory Visit: Payer: Self-pay

## 2020-05-09 ENCOUNTER — Encounter: Payer: Self-pay | Admitting: Nurse Practitioner

## 2020-05-09 VITALS — BP 128/84 | HR 68 | Temp 98.0°F | Resp 20 | Ht 70.0 in | Wt 209.0 lb

## 2020-05-09 DIAGNOSIS — H60331 Swimmer's ear, right ear: Secondary | ICD-10-CM | POA: Diagnosis not present

## 2020-05-09 MED ORDER — NEOMYCIN-POLYMYXIN-HC 3.5-10000-1 OT SOLN: 3.0000 [drp] | Freq: Four times a day (QID) | OTIC | 0 refills | Status: DC

## 2020-05-09 NOTE — Patient Instructions (Signed)

## 2020-05-09 NOTE — Progress Notes (Signed)
   Subjective:    Patient ID: Kevin Hughes, male    DOB: 13-Dec-1985, 35 y.o.   MRN: [id number]   Chief Complaint: bilateral ear pain   HPI  Pt presents today for c/o bilateral ear pain, R>L. Both ears feel full. The right ear started to have discomfort 2 days ago, the left one this morning. He used peroxide in his right ear 2 days ago with no relief. Denies sinus congestion, post nasal drainage, new onset cough, sore throat, or change in taste, drainage. One episode of dizziness when waking today at 4am. The sensation "eased up" after a few moments. No fever. Right ear is "hard to hear out of." No popping sensations in either. Denies recent exposure to infectious disease. Took a bath last night and submerged head under water last night.     Review of Systems  Constitutional: Negative for chills and fever.  HENT: Positive for ear pain. Negative for ear discharge, postnasal drip, rhinorrhea, sinus pressure, sinus pain, sneezing, sore throat, tinnitus and trouble swallowing.   Eyes: Negative for discharge.  Respiratory: Negative for cough, shortness of breath and wheezing.   Cardiovascular: Negative for chest pain and palpitations.  Genitourinary: Negative for difficulty urinating and dysuria.  Neurological: Negative for dizziness and headaches.       Objective:   Physical Exam Constitutional:      Appearance: Normal appearance.  HENT:     Left Ear: There is impacted cerumen.     Ears:     Comments: Right external auditory canal (+)erythema, tragus manipulation (+) for pain    Nose: No congestion or rhinorrhea.     Mouth/Throat:     Mouth: Mucous membranes are moist.     Pharynx: Oropharynx is clear. Posterior oropharyngeal erythema present. No oropharyngeal exudate.  Cardiovascular:     Rate and Rhythm: Normal rate and regular rhythm.  Pulmonary:     Effort: Pulmonary effort is normal.     Breath sounds: Normal breath sounds.  Abdominal:     General: Abdomen is flat.      Palpations: Abdomen is soft.  Skin:    General: Skin is warm and dry.  Neurological:     Mental Status: He is alert.    BP 128/84   Pulse 68   Temp 98 F (36.7 C) (Temporal)   Resp 20   Ht 5\' 10"  (1.778 m)   Wt 209 lb (94.8 kg)   BMI 29.99 kg/m         Assessment & Plan:  Kevin Hughes comes in today with chief complaint of bilateral ear pain   Diagnosis and orders addressed:  1. Acute swimmer's ear of right side Begin drops, avoid water in the ears, avoid cleaning or inserting objects into the ear canal.   - neomycin-polymyxin-hydrocortisone (CORTISPORIN) OTIC solution; Place 3 drops into both ears 4 (four) times daily.  Dispense: 10 mL; Refill: 0   Labs pending Health Maintenance reviewed Diet and exercise encouraged  Follow up plan: PRN  Madelin Headings, RN, BSN, FNP-Student  Mary-Margaret Oretha Milch, FNP

## 2020-05-10 DIAGNOSIS — H60501 Unspecified acute noninfective otitis externa, right ear: Secondary | ICD-10-CM

## 2020-05-11 DIAGNOSIS — H900 Conductive hearing loss, bilateral: Secondary | ICD-10-CM | POA: Diagnosis not present

## 2020-05-11 DIAGNOSIS — H6123 Impacted cerumen, bilateral: Secondary | ICD-10-CM | POA: Diagnosis not present

## 2020-05-25 MED FILL — CYCLOBENZAPRINE HCL 10 MG T: 10 | 10 days supply | Qty: 30 | Fill #4

## 2020-05-25 MED FILL — RIZATRIPTAN 10 MG ODT: 10 | 30 days supply | Qty: 9 | Fill #1

## 2020-06-09 ENCOUNTER — Other Ambulatory Visit: Payer: Self-pay | Admitting: *Deleted

## 2020-06-13 ENCOUNTER — Encounter: Payer: Self-pay | Admitting: Family Medicine

## 2020-06-14 ENCOUNTER — Other Ambulatory Visit: Payer: Self-pay

## 2020-06-14 MED ORDER — VITAMIN D (ERGOCALCIFEROL) 1.25 MG (50000 UNIT) PO CAPS: 50000.0000 [IU] | ORAL_CAPSULE | ORAL | 1 refills | Status: DC

## 2020-06-14 MED FILL — VIT D2 1.25 MG (50,000 UNIT: 1.25 MG | 84 days supply | Qty: 12 | Fill #0

## 2020-06-25 MED FILL — Rizatriptan Benzoate Oral Disintegrating Tab 10 MG (Base Eq): ORAL | 30 days supply | Qty: 9 | Fill #0 | Status: AC

## 2020-06-26 ENCOUNTER — Other Ambulatory Visit (HOSPITAL_COMMUNITY): Payer: Self-pay

## 2020-07-12 ENCOUNTER — Telehealth: Payer: 59 | Admitting: Nurse Practitioner

## 2020-07-12 DIAGNOSIS — J0100 Acute maxillary sinusitis, unspecified: Secondary | ICD-10-CM | POA: Diagnosis not present

## 2020-07-12 MED ORDER — AMOXICILLIN-POT CLAVULANATE 875-125 MG PO TABS: 1.0000 | ORAL_TABLET | Freq: Two times a day (BID) | ORAL | 0 refills | Status: DC

## 2020-07-12 NOTE — Progress Notes (Signed)

## 2020-08-24 ENCOUNTER — Encounter: Payer: Self-pay | Admitting: Family Medicine

## 2020-08-24 ENCOUNTER — Ambulatory Visit (INDEPENDENT_AMBULATORY_CARE_PROVIDER_SITE_OTHER): Payer: 59 | Admitting: Family Medicine

## 2020-08-24 ENCOUNTER — Other Ambulatory Visit: Payer: Self-pay

## 2020-08-24 ENCOUNTER — Other Ambulatory Visit (HOSPITAL_COMMUNITY): Payer: Self-pay

## 2020-08-24 VITALS — BP 130/88 | HR 78 | Temp 97.2°F | Ht 70.0 in | Wt 202.8 lb

## 2020-08-24 DIAGNOSIS — Z0001 Encounter for general adult medical examination with abnormal findings: Secondary | ICD-10-CM

## 2020-08-24 DIAGNOSIS — L708 Other acne: Secondary | ICD-10-CM | POA: Diagnosis not present

## 2020-08-24 DIAGNOSIS — L739 Follicular disorder, unspecified: Secondary | ICD-10-CM | POA: Diagnosis not present

## 2020-08-24 DIAGNOSIS — G43009 Migraine without aura, not intractable, without status migrainosus: Secondary | ICD-10-CM | POA: Diagnosis not present

## 2020-08-24 DIAGNOSIS — E559 Vitamin D deficiency, unspecified: Secondary | ICD-10-CM

## 2020-08-24 DIAGNOSIS — Z0000 Encounter for general adult medical examination without abnormal findings: Secondary | ICD-10-CM

## 2020-08-24 LAB — URINALYSIS
Bilirubin, UA: NEGATIVE
Glucose, UA: NEGATIVE
Ketones, UA: NEGATIVE
Leukocytes,UA: NEGATIVE
Nitrite, UA: NEGATIVE
Protein,UA: NEGATIVE
Specific Gravity, UA: 1.02 (ref 1.005–1.030)
Urobilinogen, Ur: 0.2 mg/dL (ref 0.2–1.0)
pH, UA: 6 (ref 5.0–7.5)

## 2020-08-24 MED ORDER — MINOCYCLINE HCL 100 MG PO CAPS
100.0000 mg | ORAL_CAPSULE | Freq: Two times a day (BID) | ORAL | 0 refills | Status: AC
  Filled 2020-08-24 (×2): qty 20, 10d supply, fill #0

## 2020-08-24 MED ORDER — MINOCYCLINE HCL 100 MG PO CAPS
100.0000 mg | ORAL_CAPSULE | Freq: Every day | ORAL | 5 refills | Status: DC
Start: 2020-08-24 — End: 2020-08-30
  Filled 2020-08-24: qty 30, 30d supply, fill #0

## 2020-08-24 MED ORDER — METRONIDAZOLE 1 % EX GEL
Freq: Every day | CUTANEOUS | 11 refills | Status: DC
  Filled 2020-08-24: qty 45, 30d supply, fill #0

## 2020-08-24 MED ORDER — MINOCYCLINE HCL 100 MG PO CAPS
100.0000 mg | ORAL_CAPSULE | Freq: Every day | ORAL | 1 refills | Status: DC
  Filled 2020-08-24: qty 90, 90d supply, fill #0

## 2020-08-24 MED ORDER — RIZATRIPTAN BENZOATE 10 MG PO TBDP
ORAL_TABLET | ORAL | 6 refills | Status: DC | PRN
  Filled 2020-08-24: qty 9, 30d supply, fill #0
  Filled 2020-10-16: qty 9, 30d supply, fill #1
  Filled 2021-01-09: qty 9, 30d supply, fill #2
  Filled 2021-02-28: qty 9, 30d supply, fill #3
  Filled 2021-05-17: qty 9, 30d supply, fill #4

## 2020-08-24 NOTE — Progress Notes (Signed)
Physical examination.  Subjective:  Patient ID: Kevin Hughes, male    DOB: [birth year]  Age: 35 y.o. MRN: [id number]  CC: Annual Exam   HPI Kevin Hughes presents for physical examination.  He continues to take vitamin D for his history of deficiency.  He also uses Maxalt before occasional migraine.  He does not take any prophylactic 6.  He has a history of asthma for which he takes Ventolin as needed.  He has mild intermittent asthma and does not use a maintenance inhaler.  Depression screen [facility] 2/9 08/24/2020 05/09/2020 08/24/2019  Decreased Interest 0 0 0  Down, Depressed, Hopeless 0 0 0  PHQ - 2 Score 0 0 0    History Kevin Hughes has a past medical history of H/O seasonal allergies, Migraine, and Migraines.   He has a past surgical history that includes Wisdom tooth extraction; Finger surgery; and Nasal septoplasty w/ turbinoplasty (Bilateral, 12/17/2019).   His family history includes Dementia in his paternal grandmother; Diabetes in his paternal grandfather; Hypertension in his mother; Throat cancer in his paternal grandfather.He reports that he has never smoked. He has never used smokeless tobacco. He reports that he does not drink alcohol and does not use drugs.    ROS Review of Systems  Constitutional:  Negative for activity change, fatigue and unexpected weight change.  HENT:  Negative for congestion, ear pain, hearing loss, postnasal drip and trouble swallowing.   Eyes:  Negative for pain and visual disturbance.  Respiratory:  Negative for cough, chest tightness and shortness of breath.   Cardiovascular:  Negative for chest pain, palpitations and leg swelling.  Gastrointestinal:  Negative for abdominal distention, abdominal pain, blood in stool, constipation, diarrhea, nausea and vomiting.  Endocrine: Negative for cold intolerance, heat intolerance and polydipsia.  Genitourinary:  Negative for difficulty urinating, dysuria, flank pain, frequency and urgency.  Musculoskeletal:  Negative  for arthralgias and joint swelling.  Skin:  Negative for color change, rash and wound.  Neurological:  Negative for dizziness, syncope, speech difficulty, weakness, light-headedness, numbness and headaches.  Hematological:  Does not bruise/bleed easily.  Psychiatric/Behavioral:  Negative for confusion, decreased concentration, dysphoric mood and sleep disturbance. The patient is not nervous/anxious.    Objective:  BP 130/88   Pulse 78   Temp (!) 97.2 F (36.2 C)   Ht '5\' 10"'  (1.778 m)   Wt 202 lb 12.8 oz (92 kg)   SpO2 95%   BMI 29.10 kg/m   BP Readings from Last 3 Encounters:  08/24/20 130/88  05/09/20 128/84  12/17/19 126/82    Wt Readings from Last 3 Encounters:  08/24/20 202 lb 12.8 oz (92 kg)  05/09/20 209 lb (94.8 kg)  12/17/19 201 lb 11.5 oz (91.5 kg)     Physical Exam Constitutional:      Appearance: He is well-developed.  HENT:     Head: Normocephalic and atraumatic.  Eyes:     Pupils: Pupils are equal, round, and reactive to light.  Neck:     Thyroid: No thyromegaly.     Trachea: No tracheal deviation.  Cardiovascular:     Rate and Rhythm: Normal rate and regular rhythm.     Heart sounds: Normal heart sounds. No murmur heard.   No friction rub. No gallop.  Pulmonary:     Breath sounds: Normal breath sounds. No wheezing or rales.  Abdominal:     General: Bowel sounds are normal. There is no distension.     Palpations: Abdomen is soft. There is no mass.  Tenderness: There is no abdominal tenderness.     Hernia: There is no hernia in the left inguinal area.  Genitourinary:    Penis: Normal.      Testes: Normal.  Musculoskeletal:        General: Normal range of motion.     Cervical back: Normal range of motion.  Lymphadenopathy:     Cervical: No cervical adenopathy.  Skin:    General: Skin is warm and dry.  Neurological:     Mental Status: He is alert and oriented to person, place, and time.      Assessment & Plan:   Kevin Hughes was seen today  for annual exam.  Diagnoses and all orders for this visit:  Well adult exam -     CBC with Differential/Platelet -     CMP14+EGFR -     Lipid panel -     VITAMIN D 25 Hydroxy (Vit-D Deficiency, Fractures) -     Urinalysis  Migraine without aura and without status migrainosus, not intractable -     rizatriptan (MAXALT-MLT) 10 MG disintegrating tablet; Take 1 tablet by mouth as needed for migraine may repeat in 2 hours if needed  Vitamin D deficiency -     VITAMIN D 25 Hydroxy (Vit-D Deficiency, Fractures)  Folliculitis  Other acne  Other orders -     metroNIDAZOLE (METROGEL) 1 % gel; Apply topically daily for roscea. -     Discontinue: minocycline (MINOCIN) 100 MG capsule; Take 1 capsule (100 mg total) by mouth daily. Take on an empty stomach -     minocycline (MINOCIN) 100 MG capsule; Take 1 capsule (100 mg total) by mouth daily. Take on an empty stomach -     minocycline (MINOCIN) 100 MG capsule; Take 1 capsule (100 mg total) by mouth 2 (two) times daily for 10 days. Take on an empty stomach      I have discontinued Kevin Hughes's fexofenadine-pseudoephedrine, albuterol, neomycin-polymyxin-hydrocortisone, amoxicillin-clavulanate, and DOXYCYCLINE PO. I have also changed his rizatriptan, metroNIDAZOLE, and minocycline. Additionally, I am having him start on minocycline. Lastly, I am having him maintain his fluticasone, Vitamin D (Ergocalciferol), and cetirizine.  Allergies as of 08/24/2020   No Known Allergies      Medication List        Accurate as of August 24, 2020 11:59 PM. If you have any questions, ask your nurse or doctor.          STOP taking these medications    albuterol 108 (90 Base) MCG/ACT inhaler Commonly known as: VENTOLIN HFA Stopped by: Claretta Fraise, MD   amoxicillin-clavulanate [id number] MG tablet Commonly known as: AUGMENTIN Stopped by: Claretta Fraise, MD   DOXYCYCLINE PO Stopped by: Claretta Fraise, MD   fexofenadine-pseudoephedrine 180-240 MG 24  hr tablet Commonly known as: ALLEGRA-D 24 Stopped by: Claretta Fraise, MD   neomycin-polymyxin-hydrocortisone OTIC solution Commonly known as: CORTISPORIN Stopped by: Claretta Fraise, MD       TAKE these medications    cetirizine 10 MG tablet Commonly known as: ZYRTEC Take 10 mg by mouth daily.   fluticasone 50 MCG/ACT nasal spray Commonly known as: FLONASE SPRAY 2 SPRAYS INTO EACH NOSTRIL EVERY DAY   metroNIDAZOLE 1 % gel Commonly known as: Metrogel Apply topically daily for roscea. What changed: additional instructions Changed by: Claretta Fraise, MD   minocycline 100 MG capsule Commonly known as: Minocin Take 1 capsule (100 mg total) by mouth daily. Take on an empty stomach Started by: Claretta Fraise, MD   minocycline  100 MG capsule Commonly known as: Minocin Take 1 capsule (100 mg total) by mouth 2 (two) times daily for 10 days. Take on an empty stomach Started by: Claretta Fraise, MD   rizatriptan 10 MG disintegrating tablet Commonly known as: MAXALT-MLT Take 1 tablet by mouth as needed for migraine may repeat in 2 hours if needed   Vitamin D (Ergocalciferol) 1.25 MG (50000 UNIT) Caps capsule Commonly known as: DRISDOL TAKE 1 CAPSULE (50,000 UNITS TOTAL) BY MOUTH EVERY 7 (SEVEN) DAYS.         Follow-up: Return in about 1 year (around 08/24/2021).  Claretta Fraise, M.D.

## 2020-08-25 ENCOUNTER — Other Ambulatory Visit (HOSPITAL_COMMUNITY): Payer: Self-pay

## 2020-08-25 LAB — CBC WITH DIFFERENTIAL/PLATELET
Basophils Absolute: 0.1 10*3/uL (ref 0.0–0.2)
Basos: 1 %
EOS (ABSOLUTE): 0.2 10*3/uL (ref 0.0–0.4)
Eos: 3 %
Hematocrit: 42.5 % (ref 37.5–51.0)
Hemoglobin: 14.8 g/dL (ref 13.0–17.7)
Immature Grans (Abs): 0 10*3/uL (ref 0.0–0.1)
Immature Granulocytes: 0 %
Lymphocytes Absolute: 1.4 10*3/uL (ref 0.7–3.1)
Lymphs: 20 %
MCH: 29.7 pg (ref 26.6–33.0)
MCHC: 34.8 g/dL (ref 31.5–35.7)
MCV: 85 fL (ref 79–97)
Monocytes Absolute: 0.7 10*3/uL (ref 0.1–0.9)
Monocytes: 10 %
Neutrophils Absolute: 4.5 10*3/uL (ref 1.4–7.0)
Neutrophils: 66 %
Platelets: 280 10*3/uL (ref 150–450)
RBC: 4.98 x10E6/uL (ref 4.14–5.80)
RDW: 12.1 % (ref 11.6–15.4)
WBC: 6.9 10*3/uL (ref 3.4–10.8)

## 2020-08-25 LAB — CMP14+EGFR
ALT: 36 IU/L (ref 0–44)
AST: 27 IU/L (ref 0–40)
Albumin/Globulin Ratio: 1.9 (ref 1.2–2.2)
Albumin: 4.6 g/dL (ref 4.0–5.0)
Alkaline Phosphatase: 96 IU/L (ref 44–121)
BUN/Creatinine Ratio: 13 (ref 9–20)
BUN: 14 mg/dL (ref 6–20)
Bilirubin Total: 0.5 mg/dL (ref 0.0–1.2)
CO2: 25 mmol/L (ref 20–29)
Calcium: 8.9 mg/dL (ref 8.7–10.2)
Chloride: 104 mmol/L (ref 96–106)
Creatinine, Ser: 1.04 mg/dL (ref 0.76–1.27)
Globulin, Total: 2.4 g/dL (ref 1.5–4.5)
Glucose: 95 mg/dL (ref 65–99)
Potassium: 4.1 mmol/L (ref 3.5–5.2)
Sodium: 143 mmol/L (ref 134–144)
Total Protein: 7 g/dL (ref 6.0–8.5)
eGFR: 95 mL/min/{1.73_m2} (ref >59)

## 2020-08-25 LAB — LIPID PANEL
Chol/HDL Ratio: 3.9 ratio (ref 0.0–5.0)
Cholesterol, Total: 175 mg/dL (ref 100–199)
HDL: 45 mg/dL (ref >39)
LDL Chol Calc (NIH): 112 mg/dL — ABNORMAL HIGH (ref 0–99)
Triglycerides: 99 mg/dL (ref 0–149)
VLDL Cholesterol Cal: 18 mg/dL (ref 5–40)

## 2020-08-25 LAB — VITAMIN D 25 HYDROXY (VIT D DEFICIENCY, FRACTURES): Vit D, 25-Hydroxy: 55.2 ng/mL (ref 30.0–100.0)

## 2020-08-27 ENCOUNTER — Encounter: Payer: Self-pay | Admitting: Family Medicine

## 2020-08-27 NOTE — Progress Notes (Signed)
Hello Kevin Hughes,  Your lab result is normal and/or stable.Some minor variations that are not significant are commonly marked abnormal, but do not represent any medical problem for you.  Best regards, Kanaan Kagawa, M.D.

## 2020-08-30 ENCOUNTER — Ambulatory Visit: Payer: 59 | Admitting: Family Medicine

## 2020-08-30 ENCOUNTER — Encounter: Payer: Self-pay | Admitting: Family Medicine

## 2020-08-30 ENCOUNTER — Other Ambulatory Visit: Payer: Self-pay

## 2020-08-30 VITALS — BP 126/83 | HR 77 | Ht 70.0 in | Wt 202.0 lb

## 2020-08-30 DIAGNOSIS — L0211 Cutaneous abscess of neck: Secondary | ICD-10-CM | POA: Diagnosis not present

## 2020-08-30 DIAGNOSIS — L03221 Cellulitis of neck: Secondary | ICD-10-CM | POA: Diagnosis not present

## 2020-08-30 DIAGNOSIS — R110 Nausea: Secondary | ICD-10-CM

## 2020-08-30 MED ORDER — ONDANSETRON 4 MG PO TBDP: 4.0000 mg | ORAL_TABLET | Freq: Three times a day (TID) | ORAL | 1 refills | Status: DC | PRN

## 2020-08-30 MED ORDER — FAMOTIDINE 10 MG PO TABS: 10.0000 mg | ORAL_TABLET | Freq: Two times a day (BID) | ORAL | 1 refills | Status: AC

## 2020-08-30 NOTE — Progress Notes (Signed)
BP 126/83   Pulse 77   Ht 5\' 10"  (1.778 m)   Wt 202 lb (91.6 kg)   SpO2 96%   BMI 28.98 kg/m    Subjective:   Patient ID: Kevin Hughes, male    DOB: Aug 12, 1985, 35 y.o.   MRN: [id number]  HPI: Kevin Hughes is a 34 y.o. male presenting on 08/30/2020 for Cyst (Inflamed, draining, red, swollen)   HPI Patient is coming in with an abscess behind his left ear on his neck that has been coming out for just over a week.  He says he has been taking 4 days worth of the antibiotic minocycline and then just yesterday it came to ahead and then started draining.  He says it was getting worse until been and that is why he made the appointment.  He says it is draining a purulent drainage currently.  He denies any fevers or chills.  He does get some headaches because of the tightness from it going up over his head and has triggered some migraines for him.  He does get the occasional nausea and indigestion which could be due to the antibiotic he feels.  He denies any rashes or sores anywhere else except that spot behind his left ear.  He has had these before.  Relevant past medical, surgical, family and social history reviewed and updated as indicated. Interim medical history since our last visit reviewed. Allergies and medications reviewed and updated.  Review of Systems  Constitutional:  Negative for chills and fever.  Respiratory:  Negative for shortness of breath and wheezing.   Cardiovascular:  Negative for chest pain and leg swelling.  Gastrointestinal:  Positive for abdominal distention and nausea. Negative for abdominal pain.  Musculoskeletal:  Negative for back pain and gait problem.  Skin:  Positive for rash and wound.  All other systems reviewed and are negative.  Per HPI unless specifically indicated above   Allergies as of 08/30/2020   No Known Allergies      Medication List        Accurate as of August 30, 2020 11:08 AM. If you have any questions, ask your nurse or doctor.           cetirizine 10 MG tablet Commonly known as: ZYRTEC Take 10 mg by mouth daily.   famotidine 10 MG tablet Commonly known as: PEPCID Take 1 tablet (10 mg total) by mouth 2 (two) times daily. Started by: September 01, 2020 Brissia Delisa, MD   fluticasone 50 MCG/ACT nasal spray Commonly known as: FLONASE SPRAY 2 SPRAYS INTO EACH NOSTRIL EVERY DAY   metroNIDAZOLE 1 % gel Commonly known as: Metrogel Apply topically daily for roscea.   minocycline 100 MG capsule Commonly known as: Minocin Take 1 capsule (100 mg total) by mouth 2 (two) times daily for 10 days. Take on an empty stomach What changed: Another medication with the same name was removed. Continue taking this medication, and follow the directions you see here. Changed by: Elige Radon Samiha Denapoli, MD   ondansetron 4 MG disintegrating tablet Commonly known as: Zofran ODT Take 1 tablet (4 mg total) by mouth every 8 (eight) hours as needed for nausea or vomiting. Started by: Elige Radon, MD   rizatriptan 10 MG disintegrating tablet Commonly known as: MAXALT-MLT Take 1 tablet by mouth as needed for migraine may repeat in 2 hours if needed   Vitamin D (Ergocalciferol) 1.25 MG (50000 UNIT) Caps capsule Commonly known as: DRISDOL TAKE 1 CAPSULE (50,000 UNITS TOTAL) BY MOUTH  EVERY 7 (SEVEN) DAYS.         Objective:   BP 126/83   Pulse 77   Ht 5\' 10"  (1.778 m)   Wt 202 lb (91.6 kg)   SpO2 96%   BMI 28.98 kg/m   Wt Readings from Last 3 Encounters:  08/30/20 202 lb (91.6 kg)  08/24/20 202 lb 12.8 oz (92 kg)  05/09/20 209 lb (94.8 kg)    Physical Exam Vitals and nursing note reviewed.  Constitutional:      Appearance: Normal appearance.  Skin:    General: Skin is warm and dry.     Findings: Erythema, lesion (Patient has area of erythema about 3 cm in diameter with an open draining abscess in the center that feels like it has mostly drained but does have some purulent drainage still upon expression.  No major fluctuations  still present.  tender to palpation) and wound present.  Neurological:     Mental Status: He is alert.      Assessment & Plan:   Problem List Items Addressed This Visit   None Visit Diagnoses     Cellulitis and abscess of neck    -  Primary   Relevant Medications   famotidine (PEPCID) 10 MG tablet   ondansetron (ZOFRAN ODT) 4 MG disintegrating tablet   Nausea       Relevant Medications   famotidine (PEPCID) 10 MG tablet   ondansetron (ZOFRAN ODT) 4 MG disintegrating tablet       Continue minocycline, gave Pepcid and Zofran to help with nausea and indigestion.  Continue to express and do warm compresses, close up or starts getting larger then please return for possible I&D but open currently so no need for I&D.  Patient has 7 days of minocycline. Follow up plan: Return if symptoms worsen or fail to improve.  Counseling provided for all of the vaccine components No orders of the defined types were placed in this encounter.   05/11/20, MD [facility] 08/30/2020, 11:08 AM

## 2020-09-06 ENCOUNTER — Other Ambulatory Visit: Payer: Self-pay | Admitting: Family Medicine

## 2020-09-06 ENCOUNTER — Other Ambulatory Visit (HOSPITAL_COMMUNITY): Payer: Self-pay

## 2020-09-06 ENCOUNTER — Encounter: Payer: Self-pay | Admitting: Family Medicine

## 2020-09-06 DIAGNOSIS — G43009 Migraine without aura, not intractable, without status migrainosus: Secondary | ICD-10-CM

## 2020-09-06 MED ORDER — CYCLOBENZAPRINE HCL 10 MG PO TABS
10.0000 mg | ORAL_TABLET | Freq: Three times a day (TID) | ORAL | 5 refills | Status: DC | PRN
  Filled 2020-09-06: qty 30, 10d supply, fill #0
  Filled 2020-10-16: qty 30, 10d supply, fill #1
  Filled 2021-01-09: qty 30, 10d supply, fill #2
  Filled 2021-02-28: qty 30, 10d supply, fill #3
  Filled 2021-05-17: qty 30, 10d supply, fill #4

## 2020-09-07 NOTE — Addendum Note (Signed)
Addended by: Julious Payer D on: 09/07/2020 08:19 AM   Modules accepted: Orders

## 2020-09-08 ENCOUNTER — Other Ambulatory Visit: Payer: Self-pay | Admitting: *Deleted

## 2020-09-08 ENCOUNTER — Other Ambulatory Visit (HOSPITAL_COMMUNITY): Payer: Self-pay

## 2020-09-08 ENCOUNTER — Other Ambulatory Visit: Payer: Self-pay | Admitting: Family Medicine

## 2020-09-08 MED ORDER — DOXYCYCLINE HYCLATE 100 MG PO CAPS
100.0000 mg | ORAL_CAPSULE | Freq: Every day | ORAL | 1 refills | Status: DC
  Filled 2020-09-08: qty 90, 90d supply, fill #0

## 2020-09-08 MED ORDER — MINOCYCLINE HCL 100 MG PO CAPS
100.0000 mg | ORAL_CAPSULE | Freq: Every day | ORAL | 5 refills | Status: DC
  Filled 2020-09-08: qty 30, 30d supply, fill #0

## 2020-09-08 MED ORDER — MINOCYCLINE HCL 100 MG PO CAPS
100.0000 mg | ORAL_CAPSULE | Freq: Every day | ORAL | 5 refills | Status: DC
  Filled 2020-09-08: qty 90, 90d supply, fill #0

## 2020-09-14 ENCOUNTER — Encounter: Payer: Self-pay | Admitting: Family Medicine

## 2020-09-15 ENCOUNTER — Other Ambulatory Visit: Payer: Self-pay | Admitting: Family Medicine

## 2020-09-15 DIAGNOSIS — L0211 Cutaneous abscess of neck: Secondary | ICD-10-CM

## 2020-09-15 MED ORDER — PREDNISONE 20 MG PO TABS: 20.0000 mg | ORAL_TABLET | Freq: Every day | ORAL | 0 refills | Status: AC

## 2020-09-19 ENCOUNTER — Other Ambulatory Visit (HOSPITAL_COMMUNITY): Payer: Self-pay

## 2020-09-19 MED FILL — Ergocalciferol Cap 1.25 MG (50000 Unit): ORAL | 84 days supply | Qty: 12 | Fill #0 | Status: AC

## 2020-09-20 ENCOUNTER — Encounter: Payer: Self-pay | Admitting: Family Medicine

## 2020-09-20 ENCOUNTER — Ambulatory Visit: Payer: 59 | Admitting: Family Medicine

## 2020-09-20 ENCOUNTER — Other Ambulatory Visit: Payer: Self-pay

## 2020-09-20 VITALS — BP 138/88 | HR 81 | Temp 98.2°F | Ht 70.0 in | Wt 201.5 lb

## 2020-09-20 DIAGNOSIS — L0291 Cutaneous abscess, unspecified: Secondary | ICD-10-CM

## 2020-09-20 MED ORDER — SULFAMETHOXAZOLE-TRIMETHOPRIM 800-160 MG PO TABS: 1.0000 | ORAL_TABLET | Freq: Two times a day (BID) | ORAL | 0 refills | Status: AC

## 2020-09-20 MED ORDER — CEPHALEXIN 500 MG PO CAPS: 500.0000 mg | ORAL_CAPSULE | Freq: Two times a day (BID) | ORAL | 0 refills | Status: DC

## 2020-09-20 NOTE — Patient Instructions (Signed)
Skin Abscess  A skin abscess is an infected area on or under your skin that contains a collection of pus and other material. An abscess may also be called a furuncle,carbuncle, or boil. An abscess can occur in or on almost any part of your body. Some abscesses break open (rupture) on their own. Most continue to get worse unless they are treated. The infection can spread deeper into the body and eventually into your blood, whichcan make you feel ill. Treatment usually involves draining the abscess. What are the causes? An abscess occurs when germs, like bacteria, pass through your skin and cause an infection. This may be caused by: A scrape or cut on your skin. A puncture wound through your skin, including a needle injection or insect bite. Blocked oil or sweat glands. Blocked and infected hair follicles. A cyst that forms beneath your skin (sebaceous cyst) and becomes infected. What increases the risk? This condition is more likely to develop in people who: Have a weak body defense system (immune system). Have diabetes. Have dry and irritated skin. Get frequent injections or use illegal IV drugs. Have a foreign body in a wound, such as a splinter. Have problems with their lymph system or veins. What are the signs or symptoms? Symptoms of this condition include: A painful, firm bump under the skin. A bump with pus at the top. This may break through the skin and drain. Other symptoms include: Redness surrounding the abscess site. Warmth. Swelling of the lymph nodes (glands) near the abscess. Tenderness. A sore on the skin. How is this diagnosed? This condition may be diagnosed based on: A physical exam. Your medical history. A sample of pus. This may be used to find out what is causing the infection. Blood tests. Imaging tests, such as an ultrasound, CT scan, or MRI. How is this treated? A small abscess that drains on its own may not need treatment. Treatment for larger abscesses  may include: Moist heat or heat pack applied to the area several times a day. A procedure to drain the abscess (incision and drainage). Antibiotic medicines. For a severe abscess, you may first get antibiotics through an IV and then change to antibiotics by mouth. Follow these instructions at home: Medicines  Take over-the-counter and prescription medicines only as told by your health care provider. If you were prescribed an antibiotic medicine, take it as told by your health care provider. Do not stop taking the antibiotic even if you start to feel better.  Abscess care  If you have an abscess that has not drained, apply heat to the affected area. Use the heat source that your health care provider recommends, such as a moist heat pack or a heating pad. Place a towel between your skin and the heat source. Leave the heat on for 20-30 minutes. Remove the heat if your skin turns bright red. This is especially important if you are unable to feel pain, heat, or cold. You may have a greater risk of getting burned. Follow instructions from your health care provider about how to take care of your abscess. Make sure you: Cover the abscess with a bandage (dressing). Change your dressing or gauze as told by your health care provider. Wash your hands with soap and water before you change the dressing or gauze. If soap and water are not available, use hand sanitizer. Check your abscess every day for signs of a worsening infection. Check for: More redness, swelling, or pain. More fluid or blood. Warmth. More   pus or a bad smell.  General instructions To avoid spreading the infection: Do not share personal care items, towels, or hot tubs with others. Avoid making skin contact with other people. Keep all follow-up visits as told by your health care provider. This is important. Contact a health care provider if you have: More redness, swelling, or pain around your abscess. More fluid or blood coming  from your abscess. Warm skin around your abscess. More pus or a bad smell coming from your abscess. A fever. Muscle aches. Chills or a general ill feeling. Get help right away if you: Have severe pain. See red streaks on your skin spreading away from the abscess. Summary A skin abscess is an infected area on or under your skin that contains a collection of pus and other material. A small abscess that drains on its own may not need treatment. Treatment for larger abscesses may include having a procedure to drain the abscess and taking an antibiotic. This information is not intended to replace advice given to you by your health care provider. Make sure you discuss any questions you have with your healthcare provider. Document Revised: 06/25/2018 Document Reviewed: 04/17/2017 Elsevier Patient Education  2022 [employer]  

## 2020-09-20 NOTE — Progress Notes (Signed)
Acute Office Visit  Subjective:    Patient ID: Kevin Hughes, male    DOB: January 15, 1986, 35 y.o.   MRN: [id number]  Chief Complaint  Patient presents with   Abscess    HPI Patient is in today for abscess on his right cheek for one week. It is a little tender. He has not had any drainage from it. Denies fever.  He has been taking minocycline since 6/15 for one on behind his left ear that has improved. He was taking this BID but now has been taking it once a day. He was given a Rx for 6 months for this.   Past Medical History:  Diagnosis Date   H/O seasonal allergies    Migraine    Migraines     Past Surgical History:  Procedure Laterality Date   FINGER SURGERY     NASAL SEPTOPLASTY W/ TURBINOPLASTY Bilateral 12/17/2019   Procedure: NASAL SEPTOPLASTY WITH TURBINATE REDUCTION;  Surgeon: Leta Baptist, MD;  Location: [facility];  Service: ENT;  Laterality: Bilateral;   WISDOM TOOTH EXTRACTION      Family History  Problem Relation Age of Onset   Hypertension Mother    Throat cancer Paternal Grandfather    Diabetes Paternal Grandfather    Dementia Paternal Grandmother     Social History   Socioeconomic History   Marital status: Married    Spouse name: Marguarite Arbour   Number of children: 1   Years of education: Not on file   Highest education level: Bachelor's degree (e.g., BA, AB, BS)  Occupational History   Not on file  Tobacco Use   Smoking status: Never   Smokeless tobacco: Never  Vaping Use   Vaping Use: Never used  Substance and Sexual Activity   Alcohol use: No   Drug use: No   Sexual activity: Not on file  Other Topics Concern   Not on file  Social History Narrative   ** Merged History Encounter **    Lives at home with wife, son and mother in [employer]nel [employer] education   Caffeine- 2 sodas daily   Social Determinants of Health   Financial Resource Strain: Not on file  Food Insecurity: Not on file  Transportation Needs: Not  on file  Physical Activity: Not on file  Stress: Not on file  Social Connections: Not on file  Intimate Partner Violence: Not on file    Outpatient Medications Prior to Visit  Medication Sig Dispense Refill   cetirizine (ZYRTEC) 10 MG tablet Take 10 mg by mouth daily.     cyclobenzaprine (FLEXERIL) 10 MG tablet Take 1 tablet (10 mg total) by mouth 3 (three) times daily as needed for muscle spasms. 30 tablet 5   famotidine (PEPCID) 10 MG tablet Take 1 tablet (10 mg total) by mouth 2 (two) times daily. 30 tablet 1   fluticasone (FLONASE) 50 MCG/ACT nasal spray SPRAY 2 SPRAYS INTO EACH NOSTRIL EVERY DAY 48 g 1   metroNIDAZOLE (METROGEL) 1 % gel Apply topically daily for roscea. 45 g 11   minocycline (MINOCIN) 100 MG capsule Take 1 capsule (100 mg total) by mouth daily. Take on an empty stomach 30 capsule 5   ondansetron (ZOFRAN ODT) 4 MG disintegrating tablet Take 1 tablet (4 mg total) by mouth every 8 (eight) hours as needed for nausea or vomiting. 20 tablet 1   predniSONE (DELTASONE) 20 MG tablet Take 1 tablet (20 mg total) by mouth daily with  breakfast for 7 days. 7 tablet 0   rizatriptan (MAXALT-MLT) 10 MG disintegrating tablet Take 1 tablet by mouth as needed for migraine may repeat in 2 hours if needed 9 tablet 6   Vitamin D, Ergocalciferol, (DRISDOL) 1.25 MG (50000 UNIT) CAPS capsule TAKE 1 CAPSULE (50,000 UNITS TOTAL) BY MOUTH EVERY 7 (SEVEN) DAYS. 13 capsule 1   No facility-administered medications prior to visit.    No Known Allergies  Review of Systems As per HPI.     Objective:    Physical Exam Vitals and nursing note reviewed.  Cardiovascular:     Rate and Rhythm: Normal rate and regular rhythm.     Heart sounds: Normal heart sounds. No murmur heard. Pulmonary:     Effort: Pulmonary effort is normal. No respiratory distress.     Breath sounds: Normal breath sounds.  Skin:    General: Skin is warm and dry.     Findings: Abscess (right lower cheek with induration. No  drainage or fluctuance present currently) present.  Neurological:     General: No focal deficit present.     Mental Status: He is oriented to person, place, and time.  Psychiatric:        Mood and Affect: Mood normal.        Behavior: Behavior normal.    BP 138/88   Pulse 81   Temp 98.2 F (36.8 C) (Oral)   Ht _0  (1.778 m)   Wt 201 lb 8 oz (91.4 kg)   BMI 28.91 kg/m  Wt Readings from Last 3 Encounters:  09/20/20 201 lb 8 oz (91.4 kg)  08/30/20 202 lb (91.6 kg)  08/24/20 202 lb 12.8 oz (92 kg)    Health Maintenance Due  Topic Date Due   COVID-19 Vaccine (1) Never done    There are no preventive care reminders to display for this patient.   No results found for: TSH Lab Results  Component Value Date   WBC 6.9 08/24/2020   HGB 14.8 08/24/2020   HCT 42.5 08/24/2020   MCV 85 08/24/2020   PLT 280 08/24/2020   Lab Results  Component Value Date   NA 143 08/24/2020   K 4.1 08/24/2020   CO2 25 08/24/2020   GLUCOSE 95 08/24/2020   BUN 14 08/24/2020   CREATININE 1.04 08/24/2020   BILITOT 0.5 08/24/2020   ALKPHOS 96 08/24/2020   AST 27 08/24/2020   ALT 36 08/24/2020   PROT 7.0 08/24/2020   ALBUMIN 4.6 08/24/2020   CALCIUM 8.9 08/24/2020   EGFR 95 08/24/2020   Lab Results  Component Value Date   CHOL 175 08/24/2020   Lab Results  Component Value Date   HDL 45 08/24/2020   Lab Results  Component Value Date   LDLCALC 112 (H) 08/24/2020   Lab Results  Component Value Date   TRIG 99 08/24/2020   Lab Results  Component Value Date   CHOLHDL 3.9 08/24/2020   No results found for: HGBA1C     Assessment & Plan:   Toran was seen today for abscess.  Diagnoses and all orders for this visit:  Abscess Currently on doxycyline treatment for previous abscess. This is a new abscess for the last week. Will switch to bactrim as below. Discussed warm compresses. Handout given.  -     sulfamethoxazole-trimethoprim (BACTRIM DS) 800-160 MG tablet; Take 1 tablet  by mouth 2 (two) times daily for 14 days.  Return to office for new or worsening symptoms, or if symptoms  persist.   The patient indicates understanding of these issues and agrees with the plan.   Gwenlyn Perking, FNP

## 2020-10-16 ENCOUNTER — Other Ambulatory Visit (HOSPITAL_COMMUNITY): Payer: Self-pay

## 2020-10-19 ENCOUNTER — Encounter: Payer: Self-pay | Admitting: Family Medicine

## 2020-10-31 ENCOUNTER — Ambulatory Visit: Payer: 59 | Admitting: Family Medicine

## 2020-10-31 ENCOUNTER — Encounter: Payer: Self-pay | Admitting: Family Medicine

## 2020-10-31 ENCOUNTER — Other Ambulatory Visit: Payer: Self-pay

## 2020-10-31 VITALS — BP 132/88 | HR 88 | Temp 98.0°F | Ht 70.0 in | Wt 205.6 lb

## 2020-10-31 DIAGNOSIS — L02821 Furuncle of head [any part, except face]: Secondary | ICD-10-CM | POA: Diagnosis not present

## 2020-10-31 MED ORDER — HIBICLENS 4 % EX LIQD: Freq: Every day | CUTANEOUS | 0 refills | Status: DC | PRN

## 2020-10-31 MED ORDER — SULFAMETHOXAZOLE-TRIMETHOPRIM 800-160 MG PO TABS
1.0000 | ORAL_TABLET | Freq: Two times a day (BID) | ORAL | 0 refills | Status: DC
Start: 2020-10-31 — End: 2021-01-12

## 2020-10-31 NOTE — Progress Notes (Signed)
Subjective:  Patient ID: Kevin Hughes, male    DOB: 1985/08/11  Age: 35 y.o. MRN: [id number]  CC: No chief complaint on file.   HPI Kevin Hughes presents for multiple pustules on beard area and face and 1 on the torso.  His acne has completely cleared from his back.  He stopped taking the minocycline.  Additionally he has been treated with Bactrim DS from a recent visit for a pustule on the face.  He now has 2 nodules on the mandibular area 1 on the right 1 on the left.  Both seem to be getting better but they are forming hard knots.  Depression screen Kevin Hughes 2/9 10/31/2020 09/20/2020 08/30/2020  Decreased Interest 0 0 0  Down, Depressed, Hopeless 0 0 0  PHQ - 2 Score 0 0 0  Altered sleeping - 0 -  Tired, decreased energy - 0 -  Change in appetite - 0 -  Feeling bad or failure about yourself  - 0 -  Trouble concentrating - 0 -  Moving slowly or fidgety/restless - 0 -  Suicidal thoughts - 0 -  PHQ-9 Score - 0 -  Difficult doing work/chores - Not difficult at all -    History Kevin Hughes has a past medical history of H/O seasonal allergies, Migraine, and Migraines.   He has a past surgical history that includes Wisdom tooth extraction; Finger surgery; and Nasal septoplasty w/ turbinoplasty (Bilateral, 12/17/2019).   His family history includes Dementia in his paternal grandmother; Diabetes in his paternal grandfather; Hypertension in his mother; Throat cancer in his paternal grandfather.He reports that he has never smoked. He has never used smokeless tobacco. He reports that he does not drink alcohol and does not use drugs.    ROS Review of Systems  Constitutional:  Negative for fatigue and fever.   Objective:  BP 132/88   Pulse 88   Temp 98 F (36.7 C)   Ht 5\' 10"  (1.778 m)   Wt 205 lb 9.6 oz (93.3 kg)   SpO2 97%   BMI 29.50 kg/m   BP Readings from Last 3 Encounters:  10/31/20 132/88  09/20/20 138/88  08/30/20 126/83    Wt Readings from Last 3 Encounters:  10/31/20 205 lb  9.6 oz (93.3 kg)  09/20/20 201 lb 8 oz (91.4 kg)  08/30/20 202 lb (91.6 kg)     Physical Exam Constitutional:      Appearance: Normal appearance. He is normal weight.  HENT:     Head: Normocephalic.     Right Ear: Tympanic membrane and external ear normal.     Left Ear: Tympanic membrane and external ear normal.  Cardiovascular:     Rate and Rhythm: Normal rate and regular rhythm.  Pulmonary:     Effort: Pulmonary effort is normal.     Breath sounds: Normal breath sounds.  Skin:    Findings: Lesion (There are 2 rubbery hard nodules each about 6 mm 1 on the right submandibular area and 1 on the left.  There is an area of erythema at the left temple that is about 4 mm with minimal weeping.) present.  Neurological:     Mental Status: He is alert.      Assessment & Plan:   Diagnoses and all orders for this visit:  Furunculosis of head  Other orders -     chlorhexidine (HIBICLENS) 4 % external liquid; Apply topically daily as needed. -     sulfamethoxazole-trimethoprim (BACTRIM DS) 800-160 MG tablet; Take 1 tablet  by mouth 2 (two) times daily. Until gone, for infection      I am having Kevin Hughes start on Hibiclens and sulfamethoxazole-trimethoprim. I am also having him maintain his fluticasone, Vitamin D (Ergocalciferol), cetirizine, rizatriptan, metroNIDAZOLE, famotidine, ondansetron, cyclobenzaprine, and minocycline.  Allergies as of 10/31/2020   No Known Allergies      Medication List        Accurate as of October 31, 2020  9:23 PM. If you have any questions, ask your nurse or doctor.          cetirizine 10 MG tablet Commonly known as: ZYRTEC Take 10 mg by mouth daily.   cyclobenzaprine 10 MG tablet Commonly known as: FLEXERIL Take 1 tablet (10 mg total) by mouth 3 (three) times daily as needed for muscle spasms.   famotidine 10 MG tablet Commonly known as: PEPCID Take 1 tablet (10 mg total) by mouth 2 (two) times daily.   fluticasone 50 MCG/ACT  nasal spray Commonly known as: FLONASE SPRAY 2 SPRAYS INTO EACH NOSTRIL EVERY DAY   Hibiclens 4 % external liquid Generic drug: chlorhexidine Apply topically daily as needed. Started by: Kevin Claude, MD   metroNIDAZOLE 1 % gel Commonly known as: Metrogel Apply topically daily for roscea.   minocycline 100 MG capsule Commonly known as: Minocin Take 1 capsule (100 mg total) by mouth daily. Take on an empty stomach   ondansetron 4 MG disintegrating tablet Commonly known as: Zofran ODT Take 1 tablet (4 mg total) by mouth every 8 (eight) hours as needed for nausea or vomiting.   rizatriptan 10 MG disintegrating tablet Commonly known as: MAXALT-MLT Take 1 tablet by mouth as needed for migraine may repeat in 2 hours if needed   sulfamethoxazole-trimethoprim 800-160 MG tablet Commonly known as: BACTRIM DS Take 1 tablet by mouth 2 (two) times daily. Until gone, for infection Started by: Kevin Claude, MD   Vitamin D (Ergocalciferol) 1.25 MG (50000 UNIT) Caps capsule Commonly known as: DRISDOL TAKE 1 CAPSULE (50,000 UNITS TOTAL) BY MOUTH EVERY 7 (SEVEN) DAYS.         Follow-up: Return if symptoms worsen or fail to improve.  Kevin Hughes, M.D.

## 2020-11-30 ENCOUNTER — Other Ambulatory Visit: Payer: Self-pay

## 2020-12-26 MED FILL — Ergocalciferol Cap 1.25 MG (50000 Unit): ORAL | 84 days supply | Qty: 12 | Fill #1 | Status: CN

## 2020-12-27 ENCOUNTER — Other Ambulatory Visit (HOSPITAL_COMMUNITY): Payer: Self-pay

## 2020-12-27 ENCOUNTER — Other Ambulatory Visit: Payer: Self-pay | Admitting: Family Medicine

## 2020-12-27 MED ORDER — VITAMIN D (ERGOCALCIFEROL) 1.25 MG (50000 UNIT) PO CAPS
50000.0000 [IU] | ORAL_CAPSULE | ORAL | 1 refills | Status: DC
  Filled 2020-12-27: qty 12, 84d supply, fill #0
  Filled 2021-01-28 – 2021-03-21 (×2): qty 12, 84d supply, fill #1
  Filled 2021-06-13: qty 12, 84d supply, fill #2

## 2021-01-01 ENCOUNTER — Ambulatory Visit: Payer: 59 | Admitting: Family Medicine

## 2021-01-01 ENCOUNTER — Encounter: Payer: Self-pay | Admitting: Family Medicine

## 2021-01-01 DIAGNOSIS — U071 COVID-19: Secondary | ICD-10-CM

## 2021-01-01 MED ORDER — DEXAMETHASONE 6 MG PO TABS: 6.0000 mg | ORAL_TABLET | Freq: Every day | ORAL | 0 refills | Status: AC

## 2021-01-01 MED ORDER — MOLNUPIRAVIR EUA 200MG CAPSULE
4.0000 | ORAL_CAPSULE | Freq: Two times a day (BID) | ORAL | 0 refills | Status: AC
Start: 2021-01-01 — End: 2021-01-06

## 2021-01-01 MED ORDER — ALBUTEROL SULFATE HFA 108 (90 BASE) MCG/ACT IN AERS: 2.0000 | INHALATION_SPRAY | Freq: Four times a day (QID) | RESPIRATORY_TRACT | 0 refills | Status: DC | PRN

## 2021-01-01 NOTE — Progress Notes (Signed)
Virtual Visit via Telephone Note  I connected with Kevin Hughes on 01/01/21 at 8:07 AM by telephone and verified that I am speaking with the correct person using two identifiers. Kevin Hughes is currently located at home and nobody is currently with him during this visit. The provider, Gwenlyn Fudge, FNP is located in their office at time of visit.  I discussed the limitations, risks, security and privacy concerns of performing an evaluation and management service by telephone and the availability of in person appointments. I also discussed with the patient that there may be a patient responsible charge related to this service. The patient expressed understanding and agreed to proceed.  Subjective: PCP: Mechele Claude, MD  Chief Complaint  Patient presents with   Covid Positive   Patient complains of cough, head congestion, sore throat, fever, wheezing, and body aches . Max temp 102. Onset of symptoms was 2 days ago, gradually worsening since that time. He is drinking plenty of fluids. Evaluation to date: at home COVID test positive. Treatment to date:  Nyquil, Tylenol, & Ibuprofen . He does not smoke.    ROS: Per HPI  Current Outpatient Medications:    cetirizine (ZYRTEC) 10 MG tablet, Take 10 mg by mouth daily., Disp: , Rfl:    chlorhexidine (HIBICLENS) 4 % external liquid, Apply topically daily as needed., Disp: 120 mL, Rfl: 0   cyclobenzaprine (FLEXERIL) 10 MG tablet, Take 1 tablet (10 mg total) by mouth 3 (three) times daily as needed for muscle spasms., Disp: 30 tablet, Rfl: 5   famotidine (PEPCID) 10 MG tablet, Take 1 tablet (10 mg total) by mouth 2 (two) times daily., Disp: 30 tablet, Rfl: 1   fluticasone (FLONASE) 50 MCG/ACT nasal spray, SPRAY 2 SPRAYS INTO EACH NOSTRIL EVERY DAY, Disp: 48 g, Rfl: 1   metroNIDAZOLE (METROGEL) 1 % gel, Apply topically daily for roscea., Disp: 45 g, Rfl: 11   minocycline (MINOCIN) 100 MG capsule, Take 1 capsule (100 mg total) by mouth daily. Take  on an empty stomach (Patient not taking: Reported on 10/31/2020), Disp: 30 capsule, Rfl: 5   ondansetron (ZOFRAN ODT) 4 MG disintegrating tablet, Take 1 tablet (4 mg total) by mouth every 8 (eight) hours as needed for nausea or vomiting., Disp: 20 tablet, Rfl: 1   rizatriptan (MAXALT-MLT) 10 MG disintegrating tablet, Take 1 tablet by mouth as needed for migraine may repeat in 2 hours if needed, Disp: 9 tablet, Rfl: 6   sulfamethoxazole-trimethoprim (BACTRIM DS) 800-160 MG tablet, Take 1 tablet by mouth 2 (two) times daily. Until gone, for infection, Disp: 60 tablet, Rfl: 0   Vitamin D, Ergocalciferol, (DRISDOL) 1.25 MG (50000 UNIT) CAPS capsule, Take 1 capsule (50,000 Units total) by mouth every 7 (seven) days., Disp: 13 capsule, Rfl: 1  No Known Allergies Past Medical History:  Diagnosis Date   H/O seasonal allergies    Migraine    Migraines     Observations/Objective: A&O  No respiratory distress or wheezing audible over the phone Mood, judgement, and thought processes all WNL  Assessment and Plan: 1. COVID-19 Continue symptom management. Work note provided via Clinical cytogeneticist.  - molnupiravir EUA (LAGEVRIO) 200 mg CAPS capsule; Take 4 capsules (800 mg total) by mouth 2 (two) times daily for 5 days.  Dispense: 40 capsule; Refill: 0 - albuterol (VENTOLIN HFA) 108 (90 Base) MCG/ACT inhaler; Inhale 2 puffs into the lungs every 6 (six) hours as needed for wheezing or shortness of breath.  Dispense: 18 g; Refill: 0 -  dexamethasone (DECADRON) 6 MG tablet; Take 1 tablet (6 mg total) by mouth daily for 5 days.  Dispense: 5 tablet; Refill: 0   Follow Up Instructions:  I discussed the assessment and treatment plan with the patient. The patient was provided an opportunity to ask questions and all were answered. The patient agreed with the plan and demonstrated an understanding of the instructions.   The patient was advised to call back or seek an in-person evaluation if the symptoms worsen or if the  condition fails to improve as anticipated.  The above assessment and management plan was discussed with the patient. The patient verbalized understanding of and has agreed to the management plan. Patient is aware to call the clinic if symptoms persist or worsen. Patient is aware when to return to the clinic for a follow-up visit. Patient educated on when it is appropriate to go to the emergency department.   Time call ended: 8:18 AM  I provided 11 minutes of non-face-to-face time during this encounter.  Deliah Boston, MSN, APRN, FNP-C [facility] 01/01/21

## 2021-01-09 ENCOUNTER — Encounter: Payer: Self-pay | Admitting: Family Medicine

## 2021-01-10 ENCOUNTER — Other Ambulatory Visit (HOSPITAL_COMMUNITY): Payer: Self-pay

## 2021-01-12 ENCOUNTER — Encounter: Payer: Self-pay | Admitting: Family Medicine

## 2021-01-12 ENCOUNTER — Telehealth: Payer: 59 | Admitting: Family Medicine

## 2021-01-12 DIAGNOSIS — R0989 Other specified symptoms and signs involving the circulatory and respiratory systems: Secondary | ICD-10-CM | POA: Diagnosis not present

## 2021-01-12 MED ORDER — BENZONATATE 100 MG PO CAPS
100.0000 mg | ORAL_CAPSULE | Freq: Three times a day (TID) | ORAL | 1 refills | Status: DC | PRN
Start: 2021-01-12 — End: 2021-08-03

## 2021-01-12 MED ORDER — DEXAMETHASONE 2 MG PO TABS: ORAL_TABLET | ORAL | 0 refills | Status: DC

## 2021-01-12 NOTE — Progress Notes (Signed)
Virtual Visit via MyChart video note  I connected with Kevin Hughes on 01/12/21 at [phone] by video and verified that I am speaking with the correct person using two identifiers. Kevin Hughes is currently located at home and patient are currently with her during visit. The provider, Elige Radon Patric Buckhalter, MD is located in their office at time of visit.  Call ended at [phone]  I discussed the limitations, risks, security and privacy concerns of performing an evaluation and management service by video and the availability of in person appointments. I also discussed with the patient that there may be a patient responsible charge related to this service. The patient expressed understanding and agreed to proceed.   History and Present Illness: Patient is calling in for cough and congestion and wheezing. He was diagnosed with covid.  He was getting better but is still wheezy and week. He has neck tightness. He is trying to keep hydrated and eating well. He did do an antiviral.  He is using inhaler some.  He is having dry cough that is sometimes productive.  Showers help break it up as well. No fevers since 2 weeks ago at the beginning. No chills or body aches.   1. Chest congestion     Outpatient Encounter Medications as of 01/12/2021  Medication Sig   benzonatate (TESSALON PERLES) 100 MG capsule Take 1 capsule (100 mg total) by mouth 3 (three) times daily as needed for cough.   dexamethasone (DECADRON) 2 MG tablet Take 4 tablets for 3 days then 3 tablets for 3 days then 2 tablets for 3 days then 1 tablet for 3 days   albuterol (VENTOLIN HFA) 108 (90 Base) MCG/ACT inhaler Inhale 2 puffs into the lungs every 6 (six) hours as needed for wheezing or shortness of breath.   cetirizine (ZYRTEC) 10 MG tablet Take 10 mg by mouth daily.   chlorhexidine (HIBICLENS) 4 % external liquid Apply topically daily as needed.   cyclobenzaprine (FLEXERIL) 10 MG tablet Take 1 tablet (10 mg total) by mouth 3 (three) times daily  as needed for muscle spasms.   famotidine (PEPCID) 10 MG tablet Take 1 tablet (10 mg total) by mouth 2 (two) times daily.   fluticasone (FLONASE) 50 MCG/ACT nasal spray SPRAY 2 SPRAYS INTO EACH NOSTRIL EVERY DAY   metroNIDAZOLE (METROGEL) 1 % gel Apply topically daily for roscea.   minocycline (MINOCIN) 100 MG capsule Take 1 capsule (100 mg total) by mouth daily. Take on an empty stomach (Patient not taking: Reported on 10/31/2020)   ondansetron (ZOFRAN ODT) 4 MG disintegrating tablet Take 1 tablet (4 mg total) by mouth every 8 (eight) hours as needed for nausea or vomiting.   rizatriptan (MAXALT-MLT) 10 MG disintegrating tablet Take 1 tablet by mouth as needed for migraine may repeat in 2 hours if needed   Vitamin D, Ergocalciferol, (DRISDOL) 1.25 MG (50000 UNIT) CAPS capsule Take 1 capsule (50,000 Units total) by mouth every 7 (seven) days.   [DISCONTINUED] sulfamethoxazole-trimethoprim (BACTRIM DS) 800-160 MG tablet Take 1 tablet by mouth 2 (two) times daily. Until gone, for infection   No facility-administered encounter medications on file as of 01/12/2021.    Review of Systems  Constitutional:  Negative for chills and fever.  HENT:  Positive for congestion.   Eyes:  Negative for visual disturbance.  Respiratory:  Positive for cough and wheezing. Negative for shortness of breath.   Cardiovascular:  Negative for chest pain, palpitations and leg swelling.  Musculoskeletal:  Negative for arthralgias,  back pain and gait problem.  Skin:  Negative for rash.  All other systems reviewed and are negative.  Observations/Objective: Patient sounds comfortable and in no acute distress  Assessment and Plan: Problem List Items Addressed This Visit   None Visit Diagnoses     Chest congestion    -  Primary   Relevant Medications   dexamethasone (DECADRON) 2 MG tablet   benzonatate (TESSALON PERLES) 100 MG capsule        Follow up plan: Return if symptoms worsen or fail to improve.     I  discussed the assessment and treatment plan with the patient. The patient was provided an opportunity to ask questions and all were answered. The patient agreed with the plan and demonstrated an understanding of the instructions.   The patient was advised to call back or seek an in-person evaluation if the symptoms worsen or if the condition fails to improve as anticipated.  The above assessment and management plan was discussed with the patient. The patient verbalized understanding of and has agreed to the management plan. Patient is aware to call the clinic if symptoms persist or worsen. Patient is aware when to return to the clinic for a follow-up visit. Patient educated on when it is appropriate to go to the emergency department.    I provided 11 minutes of non-face-to-face time during this encounter.    Nils Pyle, MD

## 2021-01-29 ENCOUNTER — Other Ambulatory Visit (HOSPITAL_COMMUNITY): Payer: Self-pay

## 2021-01-31 ENCOUNTER — Other Ambulatory Visit (HOSPITAL_COMMUNITY): Payer: Self-pay

## 2021-02-15 ENCOUNTER — Telehealth: Payer: 59 | Admitting: Emergency Medicine

## 2021-02-15 DIAGNOSIS — J019 Acute sinusitis, unspecified: Secondary | ICD-10-CM | POA: Diagnosis not present

## 2021-02-15 MED ORDER — AMOXICILLIN-POT CLAVULANATE 875-125 MG PO TABS: 1.0000 | ORAL_TABLET | Freq: Two times a day (BID) | ORAL | 0 refills | Status: DC

## 2021-02-15 NOTE — Progress Notes (Signed)
I have spent 5 minutes in review of e-visit questionnaire, review and updating patient chart, medical decision making and response to patient.   Lynzee Lindquist, PA-C    

## 2021-02-15 NOTE — Progress Notes (Signed)

## 2021-02-28 ENCOUNTER — Other Ambulatory Visit (HOSPITAL_COMMUNITY): Payer: Self-pay

## 2021-03-21 ENCOUNTER — Other Ambulatory Visit (HOSPITAL_COMMUNITY): Payer: Self-pay

## 2021-05-18 ENCOUNTER — Other Ambulatory Visit (HOSPITAL_COMMUNITY): Payer: Self-pay

## 2021-05-25 ENCOUNTER — Telehealth: Payer: 59 | Admitting: Family Medicine

## 2021-05-25 DIAGNOSIS — B356 Tinea cruris: Secondary | ICD-10-CM | POA: Diagnosis not present

## 2021-05-25 MED ORDER — CLOTRIMAZOLE-BETAMETHASONE 1-0.05 % EX CREA: 1.0000 "application " | TOPICAL_CREAM | Freq: Two times a day (BID) | CUTANEOUS | 0 refills | Status: DC

## 2021-05-25 NOTE — Progress Notes (Signed)
E-Visit for Jock Itch ? ?We are sorry that you are not feeling well. Here is how we plan to help! ? ?Based on what you shared with me it looks like you have tinea cruris, or ?Jock Itch?.  The symptoms of Jock Itch include red, peeling, itchy rash that affects the groin (crease where the leg meets the trunk).  This fungal infection can be spread through shared towels, clothing, bedding, or hard surfaces (particularly in moist areas) such as shower stalls, locker room floors, or pool area that has the fungus present. If you have a fungal infection on one part of your body, you can also spread it to other parts. For instance, men with a fungal infection on their feet sometimes spread it to their groin. ? ?I have sent in a cream called Lotrisone (you can also find this OTC) to be applied twice daily for 5 days, if not improved please follow up in person with your PCP.  ? ?HOME CARE: ? ?Keep affected area clean, dry, and cool. ?Wash with soap and shampoo after sports or exercise and dry yourself well after bathing or swimming ?Wear cotton underwear and change them if they become damp or sweaty. ?Avoid using swimming pools, public showers, or baths. ? ?GET HELP RIGHT AWAY IF: ? ?Symptoms that don't away after treatment. ?Severe itching that persists. ?If your rash spreads or swells. ?If your rash begins to have drainage or smell. ?You develop a fever. ? ?MAKE SURE YOU  ? ?Understand these instructions. ?Will watch your condition. ?Will get help right away if you are not doing well or get worse. ? ?Thank you for choosing an e-visit. ? ?Your e-visit answers were reviewed by a board certified advanced clinical practitioner to complete your personal care plan. Depending upon the condition, your plan could have included both over the counter or prescription medications. ? ?Please review your pharmacy choice. Make sure the pharmacy is open so you can pick up prescription now. If there is a problem, you may contact your provider  through [employer] and have the prescription routed to another pharmacy.  Your safety is important to [place]. If you have drug allergies check your prescription carefully.  ? ?For the next 24 hours you can use MyChart to ask questions about today's visit, request a non-urgent call back, or ask for a work or school excuse. ?You will get an email in the next two days asking about your experience. I hope that your e-visit has been valuable and will speed your recovery. ? ? ?References or for more information: ? ?[email or web address].is ?[email or web address] ?[email or web address]=jock%[email or web address]&source=search_result&selectedTitle=3~52&usage_type=default&display_rank=3 ? ?I provided 5 minutes of non face-to-face time during this encounter for chart review, medication and order placement, as well as and documentation.  ? ?

## 2021-06-11 ENCOUNTER — Other Ambulatory Visit (HOSPITAL_COMMUNITY): Payer: Self-pay

## 2021-06-11 ENCOUNTER — Telehealth: Payer: 59 | Admitting: Emergency Medicine

## 2021-06-11 DIAGNOSIS — B9689 Other specified bacterial agents as the cause of diseases classified elsewhere: Secondary | ICD-10-CM | POA: Diagnosis not present

## 2021-06-11 DIAGNOSIS — J019 Acute sinusitis, unspecified: Secondary | ICD-10-CM | POA: Diagnosis not present

## 2021-06-11 MED ORDER — AMOXICILLIN-POT CLAVULANATE 875-125 MG PO TABS
1.0000 | ORAL_TABLET | Freq: Two times a day (BID) | ORAL | 0 refills | Status: DC
  Filled 2021-06-11: qty 14, 7d supply, fill #0

## 2021-06-11 NOTE — Progress Notes (Signed)
E-Visit for Sinus Problems ? ?We are sorry that you are not feeling well.  Here is how we plan to help! ? ?Based on what you have shared with me it looks like you have sinusitis.  Sinusitis is inflammation and infection in the sinus cavities of the head.  Based on your presentation I believe you most likely have Acute Bacterial Sinusitis.  This is an infection caused by bacteria and is treated with antibiotics. I have prescribed Augmentin 875mg /125mg  one tablet twice daily with food, for 7 days. You may use an oral decongestant such as Mucinex D or if you have glaucoma or high blood pressure use plain Mucinex. Saline nasal spray or saline irrigation such as with a Neti pot can help and can safely be used as often as needed for congestion.  If you develop worsening sinus pain, fever or notice severe headache and vision changes, or if symptoms are not better after completion of antibiotic, please schedule an appointment with a health care provider.   ? ?Sinus infections are not as easily transmitted as other respiratory infection, however we still recommend that you avoid close contact with loved ones, especially the very young and elderly.  Remember to wash your hands thoroughly throughout the day as this is the number one way to prevent the spread of infection! ? ?Home Care: ?Only take medications as instructed by your medical team. ?Complete the entire course of an antibiotic. ?Do not take these medications with alcohol. ?A steam or ultrasonic humidifier can help congestion.  You can place a towel over your head and breathe in the steam from hot water coming from a faucet. ?Avoid close contacts especially the very young and the elderly. ?Cover your mouth when you cough or sneeze. ?Always remember to wash your hands. ? ?Get Help Right Away If: ?You develop worsening fever or sinus pain. ?You develop a severe head ache or visual changes. ?Your symptoms persist after you have completed your treatment plan. ? ?Make  sure you ?Understand these instructions. ?Will watch your condition. ?Will get help right away if you are not doing well or get worse. ? ?Thank you for choosing an e-visit. ? ?Your e-visit answers were reviewed by a board certified advanced clinical practitioner to complete your personal care plan. Depending upon the condition, your plan could have included both over the counter or prescription medications. ? ?Please review your pharmacy choice. Make sure the pharmacy is open so you can pick up prescription now. If there is a problem, you may contact your provider through [employer] and have the prescription routed to another pharmacy.  Your safety is important to [place]. If you have drug allergies check your prescription carefully.  ? ?For the next 24 hours you can use MyChart to ask questions about today's visit, request a non-urgent call back, or ask for a work or school excuse. ?You will get an email in the next two days asking about your experience. I hope that your e-visit has been valuable and will speed your recovery. ? ?I have spent 5 minutes in review of e-visit questionnaire, review and updating patient chart, medical decision making and response to patient.  ? ?Willeen Cass, PhD, FNP-BC ? ? ?

## 2021-06-13 ENCOUNTER — Other Ambulatory Visit (HOSPITAL_COMMUNITY): Payer: Self-pay

## 2021-06-13 ENCOUNTER — Other Ambulatory Visit: Payer: Self-pay | Admitting: Family Medicine

## 2021-06-14 ENCOUNTER — Other Ambulatory Visit (HOSPITAL_COMMUNITY): Payer: Self-pay

## 2021-06-14 MED ORDER — VITAMIN D (ERGOCALCIFEROL) 1.25 MG (50000 UNIT) PO CAPS
50000.0000 [IU] | ORAL_CAPSULE | ORAL | 1 refills | Status: DC
  Filled 2021-06-14: qty 12, 84d supply, fill #0
  Filled 2021-09-14: qty 12, 84d supply, fill #1

## 2021-08-03 ENCOUNTER — Ambulatory Visit: Payer: BC Managed Care – PPO | Admitting: Family Medicine

## 2021-08-03 ENCOUNTER — Encounter: Payer: Self-pay | Admitting: Family Medicine

## 2021-08-03 VITALS — BP 132/88 | HR 67 | Temp 97.1°F | Ht 70.0 in | Wt 195.5 lb

## 2021-08-03 DIAGNOSIS — B356 Tinea cruris: Secondary | ICD-10-CM

## 2021-08-03 MED ORDER — CLOTRIMAZOLE-BETAMETHASONE 1-0.05 % EX CREA: 1.0000 "application " | TOPICAL_CREAM | Freq: Two times a day (BID) | CUTANEOUS | 0 refills | Status: DC

## 2021-08-03 MED ORDER — FLUCONAZOLE 150 MG PO TABS: ORAL_TABLET | ORAL | 0 refills | Status: DC

## 2021-08-03 NOTE — Patient Instructions (Signed)
Jock Itch Jock itch (tinea cruris) is an infection of the skin in the groin area that is caused by a fungus. Jock itch causes an itchy rash in the groin and upper thigh area. It usually goes away in 2-3 weeks with treatment. What are the causes? The fungus that causes jock itch may be spread by: Touching a fungal infection elsewhere on your body, such as athlete's foot, and then touching your groin area. Sharing towels or clothing, such as socks or shoes, with someone who has a fungal infection. What increases the risk? Jock itch is most common in men and adolescent boys. You are also more likely to develop the condition if you: Are in a hot, humid climate. Wear tight-fitting clothing or wet bathing suits for long periods of time. Play sports. Are overweight. Have diabetes. Have a weakened body defense system (immune system). Sweat a lot. What are the signs or symptoms? Symptoms of jock itch may include: A red, pink, or brown rash in the groin area. Blisters may be present. The rash may spread to the thighs, the opening between the buttocks (anus), and the buttocks. Dry and scaly skin on or around the rash. Itchiness. How is this diagnosed? In most cases, your health care provider can make the diagnosis by looking at your rash. In some cases, a sample of infected skin may be scraped off. This sample may be examined under a microscope (biopsy) or by trying to grow the fungus from the sample (culture). How is this treated? Treatment for this condition may include: Antifungal medicine to kill the fungus. This may be a skin cream, ointment, or powder, or it may be a medicine that you take by mouth (orally). Skin cream or ointment to reduce itching. Lifestyle changes, such as wearing looser clothing and caring for your skin. Follow these instructions at home: Skin care Apply skin creams, ointments, or powders exactly as told by your health care provider. Wear loose-fitting clothing that does  not rub against your groin area. Men should wear boxer shorts or loose-fitting underwear. Keep your groin area clean and dry. Change your underwear every day. Change out of wet bathing suits as soon as possible. After bathing, use a separate towel to gently dry your groin area thoroughly. Using a separate towel will help prevent spreading the infection to other parts of your body. Avoid hot baths and showers. Hot water can make itching worse. Do not scratch the affected area. General instructions Take and apply over-the-counter and prescription medicines only as told by your health care provider. Do not share towels, clothing, or personal items with other people. Wash your hands often with soap and water for at least 20 seconds, especially after touching your groin area. If soap and water are not available, use hand sanitizer. When at the gym: Always wear shoes, especially in the shower and around the swimming pool. Keep any cuts covered. Disinfect any mats or equipment before using them. Shower immediately after working out. Keep all follow-up visits. This is important. Contact a health care provider if: Your rash: Gets worse or does not get better after 2 weeks of treatment. Spreads. Returns after treatment is finished. You have any of the following: A fever. New or worsening redness, swelling, or pain around your rash. Fluid, blood, or pus coming from your rash. Summary Jock itch (tinea cruris) is a fungal infection of the skin in the groin area. The fungus can be spread by sharing clothing or by touching a fungus infection   elsewhere on your body and then touching your groin area. Treatment may include antifungal medicine and lifestyle changes, such as keeping the area clean and dry. This information is not intended to replace advice given to you by your health care provider. Make sure you discuss any questions you have with your health care provider. Document Revised: 05/23/2020  Document Reviewed: 05/23/2020 Elsevier Patient Education  2023 Elsevier Inc.  

## 2021-08-03 NOTE — Progress Notes (Signed)
   Acute Office Visit  Subjective:     Patient ID: Kevin Hughes, male    DOB: 11/04/1985, 36 y.o.   MRN: [id number]  Chief Complaint  Patient presents with   Rash    HPI Patient is in today for jock itch. This has been present for the last 2 months. He has been using Lotrisone cream with improvement but the rash has never cleared up. He misplaced the cream a few days ago so the symptoms worsened. He found the cream and started using it again yesterday and it has already improved. The rash is on his scrotum. It is itchy, red, sometimes with raised bumps. It is sometimes tender and painful. He work in a hot environment and sweats frequently at work. The rash has not spread.    ROS As per HPI.      Objective:    BP 132/88   Pulse 67   Temp (!) 97.1 F (36.2 C) (Temporal)   Ht 5\' 10"  (1.778 m)   Wt 195 lb 8 oz (88.7 kg)   SpO2 98%   BMI 28.05 kg/m    Physical Exam Vitals and nursing note reviewed.  Constitutional:      General: He is not in acute distress.    Appearance: He is not ill-appearing, toxic-appearing or diaphoretic.  Cardiovascular:     Rate and Rhythm: Normal rate and regular rhythm.  Pulmonary:     Effort: Pulmonary effort is normal. No respiratory distress.  Genitourinary:    Penis: Normal and circumcised. No swelling or lesions.      Testes:        Right: Tenderness or swelling not present.        Left: Tenderness or swelling not present.     Comments: Rash present to scrotum. No exudate, ulcerations, or swelling present.  Skin:    General: Skin is warm and dry.     Findings: Rash present.  Neurological:     General: No focal deficit present.     Mental Status: He is alert and oriented to person, place, and time.    No results found for any visits on 08/03/21.      Assessment & Plan:   Kevin Hughes was seen today for rash.  Diagnoses and all orders for this visit:  Jock itch Failed lotrisone treatment. Diflucan as below. Refill provided on  Lotrisone to use prn. Discussed loose, quick dry clothing. Discussed may need to change clothes during work shifts. Return to office for new or worsening symptoms, or if symptoms persist.  -     fluconazole (DIFLUCAN) 150 MG tablet; 1 po q week x 4 weeks -     clotrimazole-betamethasone (LOTRISONE) cream; Apply 1 application. topically 2 (two) times daily.  Return if symptoms worsen or fail to improve.  The patient indicates understanding of these issues and agrees with the plan.  Casimiro Needle, FNP

## 2021-09-14 ENCOUNTER — Other Ambulatory Visit (HOSPITAL_COMMUNITY): Payer: Self-pay

## 2021-09-26 ENCOUNTER — Encounter: Payer: Self-pay | Admitting: Family Medicine

## 2021-09-26 DIAGNOSIS — G43009 Migraine without aura, not intractable, without status migrainosus: Secondary | ICD-10-CM

## 2021-09-26 MED ORDER — CYCLOBENZAPRINE HCL 10 MG PO TABS
10.0000 mg | ORAL_TABLET | Freq: Three times a day (TID) | ORAL | 5 refills | Status: DC | PRN
  Filled 2021-09-26 – 2021-10-01 (×2): qty 30, 10d supply, fill #0

## 2021-09-27 ENCOUNTER — Other Ambulatory Visit (HOSPITAL_COMMUNITY): Payer: Self-pay

## 2021-10-01 ENCOUNTER — Other Ambulatory Visit (HOSPITAL_COMMUNITY): Payer: Self-pay

## 2021-11-13 ENCOUNTER — Ambulatory Visit (INDEPENDENT_AMBULATORY_CARE_PROVIDER_SITE_OTHER): Payer: BC Managed Care – PPO | Admitting: Family Medicine

## 2021-11-13 ENCOUNTER — Encounter: Payer: Self-pay | Admitting: Family Medicine

## 2021-11-13 VITALS — BP 129/82 | HR 58 | Temp 97.3°F | Ht 70.0 in | Wt 198.0 lb

## 2021-11-13 DIAGNOSIS — Z136 Encounter for screening for cardiovascular disorders: Secondary | ICD-10-CM | POA: Diagnosis not present

## 2021-11-13 DIAGNOSIS — R0683 Snoring: Secondary | ICD-10-CM

## 2021-11-13 DIAGNOSIS — G43009 Migraine without aura, not intractable, without status migrainosus: Secondary | ICD-10-CM | POA: Diagnosis not present

## 2021-11-13 DIAGNOSIS — Z0000 Encounter for general adult medical examination without abnormal findings: Secondary | ICD-10-CM

## 2021-11-13 DIAGNOSIS — U071 COVID-19: Secondary | ICD-10-CM

## 2021-11-13 DIAGNOSIS — E559 Vitamin D deficiency, unspecified: Secondary | ICD-10-CM

## 2021-11-13 DIAGNOSIS — Z0001 Encounter for general adult medical examination with abnormal findings: Secondary | ICD-10-CM | POA: Diagnosis not present

## 2021-11-13 LAB — URINALYSIS
Bilirubin, UA: NEGATIVE
Glucose, UA: NEGATIVE
Ketones, UA: NEGATIVE
Leukocytes,UA: NEGATIVE
Nitrite, UA: NEGATIVE
Protein,UA: NEGATIVE
RBC, UA: NEGATIVE
Specific Gravity, UA: 1.02 (ref 1.005–1.030)
Urobilinogen, Ur: 0.2 mg/dL (ref 0.2–1.0)
pH, UA: 5 (ref 5.0–7.5)

## 2021-11-13 MED ORDER — ALBUTEROL SULFATE HFA 108 (90 BASE) MCG/ACT IN AERS: 2.0000 | INHALATION_SPRAY | Freq: Four times a day (QID) | RESPIRATORY_TRACT | 0 refills | Status: DC | PRN

## 2021-11-13 MED ORDER — VITAMIN D (ERGOCALCIFEROL) 1.25 MG (50000 UNIT) PO CAPS: 50000.0000 [IU] | ORAL_CAPSULE | ORAL | 1 refills | Status: DC

## 2021-11-13 MED ORDER — RIZATRIPTAN BENZOATE 10 MG PO TBDP: ORAL_TABLET | ORAL | 6 refills | Status: DC | PRN

## 2021-11-13 MED ORDER — METRONIDAZOLE 1 % EX GEL: Freq: Every day | CUTANEOUS | 11 refills | Status: AC

## 2021-11-13 MED ORDER — CYCLOBENZAPRINE HCL 10 MG PO TABS: 10.0000 mg | ORAL_TABLET | Freq: Three times a day (TID) | ORAL | 5 refills | Status: DC | PRN

## 2021-11-13 NOTE — Addendum Note (Signed)
Addended by: Mechele Claude on: 11/13/2021 09:32 AM   Modules accepted: Orders

## 2021-11-13 NOTE — Progress Notes (Addendum)
Subjective:  Patient ID: Kevin Hughes, male    DOB: 04/11/1985  Age: 36 y.o. MRN: [id number]  CC: Annual Exam   HPI Kevin Hughes presents for Annual exam. Fasting       11/13/2021    8:06 AM 08/03/2021    4:23 PM 10/31/2020    4:24 PM  Depression screen PHQ 2/9  Decreased Interest 0 0 0  Down, Depressed, Hopeless 0 0 0  PHQ - 2 Score 0 0 0  Altered sleeping  0   Tired, decreased energy  0   Change in appetite  0   Feeling bad or failure about yourself   0   Trouble concentrating  0   Moving slowly or fidgety/restless  0   Suicidal thoughts  0   PHQ-9 Score  0   Difficult doing work/chores  Not difficult at all     History Kevin Hughes has a past medical history of H/O seasonal allergies, Migraine, and Migraines.   He has a past surgical history that includes Wisdom tooth extraction; Finger surgery; and Nasal septoplasty w/ turbinoplasty (Bilateral, 12/17/2019).   His family history includes Dementia in his paternal grandmother; Diabetes in his paternal grandfather; Hypertension in his mother; Throat cancer in his paternal grandfather.He reports that he has never smoked. He has never used smokeless tobacco. He reports that he does not drink alcohol and does not use drugs.    ROS Review of Systems  Constitutional:  Negative for activity change, fatigue and unexpected weight change.  HENT:  Negative for congestion, ear pain, hearing loss, postnasal drip and trouble swallowing.   Eyes:  Negative for pain and visual disturbance.  Respiratory:  Negative for cough, chest tightness and shortness of breath.   Cardiovascular:  Negative for chest pain, palpitations and leg swelling.  Gastrointestinal:  Negative for abdominal distention, abdominal pain, blood in stool, constipation, diarrhea, nausea and vomiting.  Endocrine: Negative for cold intolerance, heat intolerance and polydipsia.  Genitourinary:  Negative for difficulty urinating, dysuria, flank pain, frequency and urgency.   Musculoskeletal:  Positive for back pain (between shoulder blades). Negative for arthralgias and joint swelling.  Skin:  Negative for color change, rash and wound.  Neurological:  Positive for headaches (Occasional migraine. LEss common. Occurring in groups 2-3 back to back a week or two apart.). Negative for dizziness, syncope, speech difficulty, weakness, light-headedness and numbness.  Hematological:  Does not bruise/bleed easily.  Psychiatric/Behavioral:  Negative for confusion, decreased concentration, dysphoric mood and sleep disturbance. The patient is not nervous/anxious.     Objective:  BP 129/82   Pulse (!) 58   Temp (!) 97.3 F (36.3 C)   Ht 5' 10" (1.778 m)   Wt 198 lb (89.8 kg)   SpO2 99%   BMI 28.41 kg/m   BP Readings from Last 3 Encounters:  11/13/21 129/82  08/03/21 132/88  10/31/20 132/88    Wt Readings from Last 3 Encounters:  11/13/21 198 lb (89.8 kg)  08/03/21 195 lb 8 oz (88.7 kg)  10/31/20 205 lb 9.6 oz (93.3 kg)     Physical Exam Constitutional:      Appearance: He is well-developed.  HENT:     Head: Normocephalic and atraumatic.  Eyes:     Pupils: Pupils are equal, round, and reactive to light.  Neck:     Thyroid: No thyromegaly.     Trachea: No tracheal deviation.  Cardiovascular:     Rate and Rhythm: Normal rate and regular rhythm.  Heart sounds: Normal heart sounds. No murmur heard.    No friction rub. No gallop.  Pulmonary:     Breath sounds: Normal breath sounds. No wheezing or rales.  Abdominal:     General: Bowel sounds are normal. There is no distension.     Palpations: Abdomen is soft. There is no mass.     Tenderness: There is no abdominal tenderness.     Hernia: There is no hernia in the left inguinal area.  Genitourinary:    Penis: Normal.      Testes: Normal.  Musculoskeletal:        General: Normal range of motion.     Cervical back: Normal range of motion.  Lymphadenopathy:     Cervical: No cervical adenopathy.   Skin:    General: Skin is warm and dry.  Neurological:     Mental Status: He is alert and oriented to person, place, and time.       Assessment & Plan:   Kevin Hughes was seen today for annual exam.  Diagnoses and all orders for this visit:  Well adult exam -     CBC with Differential/Platelet -     CMP14+EGFR -     Lipid panel -     VITAMIN D 25 Hydroxy (Vit-D Deficiency, Fractures) -     Urinalysis  Vitamin D deficiency -     VITAMIN D 25 Hydroxy (Vit-D Deficiency, Fractures)  Loud snoring -     Ambulatory referral to Sleep Studies  Other orders -     Vitamin D, Ergocalciferol, (DRISDOL) 1.25 MG (50000 UNIT) CAPS capsule; Take 1 capsule (50,000 Units total) by mouth every 7 (seven) days.       I have discontinued Kevin Hughes's ondansetron and fluconazole. I am also having him maintain his fluticasone, cetirizine, rizatriptan, metroNIDAZOLE, famotidine, Hibiclens, albuterol, clotrimazole-betamethasone, cyclobenzaprine, and Vitamin D (Ergocalciferol).  Allergies as of 11/13/2021   No Known Allergies      Medication List        Accurate as of November 13, 2021  9:06 AM. If you have any questions, ask your nurse or doctor.          STOP taking these medications    fluconazole 150 MG tablet Commonly known as: Diflucan Stopped by: Claretta Fraise, MD   ondansetron 4 MG disintegrating tablet Commonly known as: Zofran ODT Stopped by: Claretta Fraise, MD       TAKE these medications    albuterol 108 (90 Base) MCG/ACT inhaler Commonly known as: VENTOLIN HFA Inhale 2 puffs into the lungs every 6 (six) hours as needed for wheezing or shortness of breath.   cetirizine 10 MG tablet Commonly known as: ZYRTEC Take 10 mg by mouth daily.   clotrimazole-betamethasone cream Commonly known as: LOTRISONE Apply 1 application. topically 2 (two) times daily.   cyclobenzaprine 10 MG tablet Commonly known as: FLEXERIL Take 1 tablet by mouth 3 times daily as needed for  muscle spasms.   famotidine 10 MG tablet Commonly known as: PEPCID Take 1 tablet (10 mg total) by mouth 2 (two) times daily.   fluticasone 50 MCG/ACT nasal spray Commonly known as: FLONASE SPRAY 2 SPRAYS INTO EACH NOSTRIL EVERY DAY   Hibiclens 4 % external liquid Generic drug: chlorhexidine Apply topically daily as needed.   metroNIDAZOLE 1 % gel Commonly known as: Metrogel Apply topically daily for roscea.   rizatriptan 10 MG disintegrating tablet Commonly known as: MAXALT-MLT Take 1 tablet by mouth as needed for migraine may  repeat in 2 hours if needed   Vitamin D (Ergocalciferol) 1.25 MG (50000 UNIT) Caps capsule Commonly known as: DRISDOL Take 1 capsule (50,000 Units total) by mouth every 7 (seven) days.       Headache significant, but not frequent enough for daily prophylaxis. Read Jenny Reichmann. BAck stretching for upper back recommended.  Follow-up: Return in about 6 months (around 05/16/2022).  Claretta Fraise, M.D.

## 2021-11-14 LAB — CMP14+EGFR
ALT: 24 IU/L (ref 0–44)
AST: 21 IU/L (ref 0–40)
Albumin/Globulin Ratio: 1.8 (ref 1.2–2.2)
Albumin: 4.3 g/dL (ref 4.1–5.1)
Alkaline Phosphatase: 85 IU/L (ref 44–121)
BUN/Creatinine Ratio: 13 (ref 9–20)
BUN: 13 mg/dL (ref 6–20)
Bilirubin Total: 0.3 mg/dL (ref 0.0–1.2)
CO2: 24 mmol/L (ref 20–29)
Calcium: 9 mg/dL (ref 8.7–10.2)
Chloride: 105 mmol/L (ref 96–106)
Creatinine, Ser: 0.97 mg/dL (ref 0.76–1.27)
Globulin, Total: 2.4 g/dL (ref 1.5–4.5)
Glucose: 97 mg/dL (ref 70–99)
Potassium: 4.1 mmol/L (ref 3.5–5.2)
Sodium: 141 mmol/L (ref 134–144)
Total Protein: 6.7 g/dL (ref 6.0–8.5)
eGFR: 102 mL/min/{1.73_m2} (ref >59)

## 2021-11-14 LAB — LIPID PANEL
Chol/HDL Ratio: 3.3 ratio (ref 0.0–5.0)
Cholesterol, Total: 170 mg/dL (ref 100–199)
HDL: 52 mg/dL (ref >39)
LDL Chol Calc (NIH): 105 mg/dL — ABNORMAL HIGH (ref 0–99)
Triglycerides: 67 mg/dL (ref 0–149)
VLDL Cholesterol Cal: 13 mg/dL (ref 5–40)

## 2021-11-14 LAB — CBC WITH DIFFERENTIAL/PLATELET
Basophils Absolute: 0 10*3/uL (ref 0.0–0.2)
Basos: 1 %
EOS (ABSOLUTE): 0.1 10*3/uL (ref 0.0–0.4)
Eos: 2 %
Hematocrit: 45.2 % (ref 37.5–51.0)
Hemoglobin: 15.3 g/dL (ref 13.0–17.7)
Immature Grans (Abs): 0 10*3/uL (ref 0.0–0.1)
Immature Granulocytes: 1 %
Lymphocytes Absolute: 1.4 10*3/uL (ref 0.7–3.1)
Lymphs: 28 %
MCH: 29.8 pg (ref 26.6–33.0)
MCHC: 33.8 g/dL (ref 31.5–35.7)
MCV: 88 fL (ref 79–97)
Monocytes Absolute: 0.6 10*3/uL (ref 0.1–0.9)
Monocytes: 12 %
Neutrophils Absolute: 3 10*3/uL (ref 1.4–7.0)
Neutrophils: 56 %
Platelets: 284 10*3/uL (ref 150–450)
RBC: 5.13 x10E6/uL (ref 4.14–5.80)
RDW: 11.8 % (ref 11.6–15.4)
WBC: 5.2 10*3/uL (ref 3.4–10.8)

## 2021-11-14 LAB — VITAMIN D 25 HYDROXY (VIT D DEFICIENCY, FRACTURES): Vit D, 25-Hydroxy: 50.1 ng/mL (ref 30.0–100.0)

## 2021-11-14 NOTE — Progress Notes (Signed)
Hello Salih,  Your lab result is normal and/or stable.Some minor variations that are not significant are commonly marked abnormal, but do not represent any medical problem for you.  Best regards, Markia Kyer, M.D.

## 2021-11-22 ENCOUNTER — Telehealth: Payer: BC Managed Care – PPO | Admitting: Physician Assistant

## 2021-11-22 DIAGNOSIS — B356 Tinea cruris: Secondary | ICD-10-CM | POA: Diagnosis not present

## 2021-11-22 MED ORDER — CLOTRIMAZOLE-BETAMETHASONE 1-0.05 % EX CREA: 1.0000 | TOPICAL_CREAM | Freq: Every day | CUTANEOUS | 0 refills | Status: DC

## 2021-11-22 MED ORDER — FLUCONAZOLE 150 MG PO TABS: 150.0000 mg | ORAL_TABLET | ORAL | 0 refills | Status: DC

## 2021-11-22 NOTE — Progress Notes (Signed)
E-Visit for [employer]  We are sorry that you are not feeling well. Here is how we plan to help!  Based on what you shared with me it looks like you have tinea cruris, or "Jock Itch".  The symptoms of Jock Itch include red, peeling, itchy rash that affects the groin (crease where the leg meets the trunk).  This fungal infection can be spread through shared towels, clothing, bedding, or hard surfaces (particularly in moist areas) such as shower stalls, locker room floors, or pool area that has the fungus present. If you have a fungal infection on one part of your body, you can also spread it to other parts. For instance, men with a fungal infection on their feet sometimes spread it to their groin.  I am recommending:Lotrisone (prescription sent) that you can use for up to 10 days in this area -- we avoid longer use as it can thin skin, etc. You can switch to a plain Clotrimazole cream OTC after the 10 day mark if needed.   Prescription medications are only indicated for an extensive rash or if over the counter treatments have failed.  I am prescribing:Fluconazole 150 mg once weekly for two to four weeks  HOME CARE:  Keep affected area clean, dry, and cool. Wash with soap and shampoo after sports or exercise and dry yourself well after bathing or swimming Wear cotton underwear and change them if they become damp or sweaty. Avoid using swimming pools, public showers, or baths.  GET HELP RIGHT AWAY IF:  Symptoms that don't away after treatment. Severe itching that persists. If your rash spreads or swells. If your rash begins to have drainage or smell. You develop a fever.  MAKE SURE YOU   Understand these instructions. Will watch your condition. Will get help right away if you are not doing well or get worse.  Thank you for choosing an e-visit.  Your e-visit answers were reviewed by a board certified advanced clinical practitioner to complete your personal care plan. Depending upon the  condition, your plan could have included both over the counter or prescription medications.  Please review your pharmacy choice. Make sure the pharmacy is open so you can pick up prescription now. If there is a problem, you may contact your provider through [employer] and have the prescription routed to another pharmacy.  Your safety is important to [place]. If you have drug allergies check your prescription carefully.   For the next 24 hours you can use MyChart to ask questions about today's visit, request a non-urgent call back, or ask for a work or school excuse. You will get an email in the next two days asking about your experience. I hope that your e-visit has been valuable and will speed your recovery.   References or for more information:  [email or web address]=search_result&selectedTitle=3~52&usage_type=default&display_rank=3

## 2021-11-22 NOTE — Progress Notes (Signed)
I have spent 5 minutes in review of e-visit questionnaire, review and updating patient chart, medical decision making and response to patient.   Palma Buster Cody Luccas Towell, PA-C    

## 2021-12-10 ENCOUNTER — Telehealth: Payer: BC Managed Care – PPO | Admitting: Physician Assistant

## 2021-12-10 DIAGNOSIS — J019 Acute sinusitis, unspecified: Secondary | ICD-10-CM

## 2021-12-10 DIAGNOSIS — B9689 Other specified bacterial agents as the cause of diseases classified elsewhere: Secondary | ICD-10-CM | POA: Diagnosis not present

## 2021-12-10 MED ORDER — AMOXICILLIN-POT CLAVULANATE 875-125 MG PO TABS: 1.0000 | ORAL_TABLET | Freq: Two times a day (BID) | ORAL | 0 refills | Status: DC

## 2021-12-10 NOTE — Progress Notes (Signed)

## 2021-12-13 ENCOUNTER — Encounter: Payer: Self-pay | Admitting: Family Medicine

## 2021-12-13 ENCOUNTER — Ambulatory Visit: Payer: BC Managed Care – PPO | Admitting: Family Medicine

## 2021-12-13 DIAGNOSIS — J069 Acute upper respiratory infection, unspecified: Secondary | ICD-10-CM | POA: Diagnosis not present

## 2021-12-13 DIAGNOSIS — R42 Dizziness and giddiness: Secondary | ICD-10-CM

## 2021-12-13 MED ORDER — PREDNISONE 20 MG PO TABS: 40.0000 mg | ORAL_TABLET | Freq: Every day | ORAL | 0 refills | Status: AC

## 2021-12-13 NOTE — Progress Notes (Signed)
   Virtual Visit  Note Due to COVID-19 pandemic this visit was conducted virtually. This visit type was conducted due to national recommendations for restrictions regarding the COVID-19 Pandemic (e.g. social distancing, sheltering in place) in an effort to limit this patient's exposure and mitigate transmission in our community. All issues noted in this document were discussed and addressed.  A physical exam was not performed with this format.  I connected with Kevin Hughes on 12/13/21 at 1337 by telephone and verified that I am speaking with the correct person using two identifiers. Kevin Hughes is currently located at work and no one is currently with him during the visit. The provider, Gwenlyn Perking, FNP is located in their office at time of visit.  I discussed the limitations, risks, security and privacy concerns of performing an evaluation and management service by telephone and the availability of in person appointments. I also discussed with the patient that there may be a patient responsible charge related to this service. The patient expressed understanding and agreed to proceed.   History and Present Illness:  HPI Upper Respiratory Infection: Patient complains of symptoms of a URI. Symptoms include congestion, cough, and sore throat. Reprots some dizziness that is intermittent with head movement and intermittent pressure in his ears. Onset of symptoms was 5 days ago, gradually improving since that time. Denies fever, chills, shortness of breath, chest pain, vomiting, or diarrhea. Evaluation to date: evisit on 12/10/21. Was prescribed augmentin, which he has been taking without improvement. He also also been taking mucinex D, and an antihistamine. He has had multiple negative home Covid tests.     ROS As per HPI.   Observations/Objective: Alert and oriented x 3. Able to speak in full sentences without difficulty.   Assessment and Plan: Kevin Hughes was seen today for uri.  Diagnoses and  all orders for this visit:  Viral URI with cough Discussed viral etiology. Continue mucinex, antihistamine. Will give prednisone burst for vertigo symptoms. Multiple negative home covid tests. Discussed symptomatic care and return precautions.  -     predniSONE (DELTASONE) 20 MG tablet; Take 2 tablets (40 mg total) by mouth daily with breakfast for 5 days.  Vertigo -     predniSONE (DELTASONE) 20 MG tablet; Take 2 tablets (40 mg total) by mouth daily with breakfast for 5 days.     Follow Up Instructions: Return to office for new or worsening symptoms, or if symptoms persist.     I discussed the assessment and treatment plan with the patient. The patient was provided an opportunity to ask questions and all were answered. The patient agreed with the plan and demonstrated an understanding of the instructions.   The patient was advised to call back or seek an in-person evaluation if the symptoms worsen or if the condition fails to improve as anticipated.  The above assessment and management plan was discussed with the patient. The patient verbalized understanding of and has agreed to the management plan. Patient is aware to call the clinic if symptoms persist or worsen. Patient is aware when to return to the clinic for a follow-up visit. Patient educated on when it is appropriate to go to the emergency department.   Time call ended:  1348  I provided 11 minutes of  non face-to-face time during this encounter.    Gwenlyn Perking, FNP

## 2021-12-16 ENCOUNTER — Other Ambulatory Visit: Payer: Self-pay | Admitting: Family Medicine

## 2021-12-16 DIAGNOSIS — U071 COVID-19: Secondary | ICD-10-CM

## 2021-12-24 ENCOUNTER — Emergency Department (HOSPITAL_COMMUNITY)
Admission: EM | Admit: 2021-12-24 | Discharge: 2021-12-24 | Disposition: A | Discharge status: Home / Self Care | Payer: BC Managed Care – PPO | Attending: Emergency Medicine | Admitting: Emergency Medicine

## 2021-12-24 ENCOUNTER — Encounter (HOSPITAL_COMMUNITY): Payer: Self-pay

## 2021-12-24 ENCOUNTER — Other Ambulatory Visit: Payer: Self-pay

## 2021-12-24 ENCOUNTER — Emergency Department (HOSPITAL_COMMUNITY): Payer: BC Managed Care – PPO

## 2021-12-24 DIAGNOSIS — N200 Calculus of kidney: Secondary | ICD-10-CM | POA: Diagnosis not present

## 2021-12-24 DIAGNOSIS — R109 Unspecified abdominal pain: Secondary | ICD-10-CM | POA: Diagnosis not present

## 2021-12-24 DIAGNOSIS — N2889 Other specified disorders of kidney and ureter: Secondary | ICD-10-CM | POA: Diagnosis not present

## 2021-12-24 HISTORY — DX: Disorder of kidney and ureter, unspecified: N28.9

## 2021-12-24 LAB — CBC WITH DIFFERENTIAL/PLATELET
Abs Immature Granulocytes: 0.03 10*3/uL (ref 0.00–0.07)
Basophils Absolute: 0 10*3/uL (ref 0.0–0.1)
Basophils Relative: 0 %
Eosinophils Absolute: 0.1 10*3/uL (ref 0.0–0.5)
Eosinophils Relative: 1 %
HCT: 43.7 % (ref 39.0–52.0)
Hemoglobin: 15.2 g/dL (ref 13.0–17.0)
Immature Granulocytes: 0 %
Lymphocytes Relative: 24 %
Lymphs Abs: 1.8 10*3/uL (ref 0.7–4.0)
MCH: 30.2 pg (ref 26.0–34.0)
MCHC: 34.8 g/dL (ref 30.0–36.0)
MCV: 86.7 fL (ref 80.0–100.0)
Monocytes Absolute: 0.7 10*3/uL (ref 0.1–1.0)
Monocytes Relative: 9 %
Neutro Abs: 4.7 10*3/uL (ref 1.7–7.7)
Neutrophils Relative %: 66 %
Platelets: 317 10*3/uL (ref 150–400)
RBC: 5.04 MIL/uL (ref 4.22–5.81)
RDW: 11.5 % (ref 11.5–15.5)
WBC: 7.4 10*3/uL (ref 4.0–10.5)
nRBC: 0 % (ref 0.0–0.2)

## 2021-12-24 LAB — URINALYSIS, ROUTINE W REFLEX MICROSCOPIC
Bilirubin Urine: NEGATIVE
Glucose, UA: NEGATIVE mg/dL
Ketones, ur: NEGATIVE mg/dL
Leukocytes,Ua: NEGATIVE
Nitrite: NEGATIVE
Protein, ur: NEGATIVE mg/dL
RBC / HPF: >50 RBC/hpf — ABNORMAL HIGH (ref 0–5)
Specific Gravity, Urine: 1.024 (ref 1.005–1.030)
pH: 5 (ref 5.0–8.0)

## 2021-12-24 LAB — BASIC METABOLIC PANEL
Anion gap: 8 (ref 5–15)
BUN: 21 mg/dL — ABNORMAL HIGH (ref 6–20)
CO2: 21 mmol/L — ABNORMAL LOW (ref 22–32)
Calcium: 8.9 mg/dL (ref 8.9–10.3)
Chloride: 109 mmol/L (ref 98–111)
Creatinine, Ser: 1.07 mg/dL (ref 0.61–1.24)
GFR, Estimated: >60 mL/min (ref >60)
Glucose, Bld: 122 mg/dL — ABNORMAL HIGH (ref 70–99)
Potassium: 3.3 mmol/L — ABNORMAL LOW (ref 3.5–5.1)
Sodium: 138 mmol/L (ref 135–145)

## 2021-12-24 MED ORDER — OXYCODONE-ACETAMINOPHEN 5-325 MG PO TABS: 1.0000 | ORAL_TABLET | Freq: Four times a day (QID) | ORAL | 0 refills | Status: DC | PRN

## 2021-12-24 MED ORDER — SODIUM CHLORIDE 0.9 % IV BOLUS
500.0000 mL | Freq: Once | INTRAVENOUS | Status: AC
  Administered 2021-12-24: 500 mL via INTRAVENOUS

## 2021-12-24 MED ORDER — ONDANSETRON HCL 4 MG PO TABS: 4.0000 mg | ORAL_TABLET | Freq: Four times a day (QID) | ORAL | 0 refills | Status: AC

## 2021-12-24 MED ORDER — IBUPROFEN 800 MG PO TABS: 800.0000 mg | ORAL_TABLET | Freq: Three times a day (TID) | ORAL | 0 refills | Status: DC

## 2021-12-24 MED ORDER — ONDANSETRON HCL 4 MG/2ML IJ SOLN
4.0000 mg | Freq: Once | INTRAMUSCULAR | Status: AC
  Administered 2021-12-24: 4 mg via INTRAVENOUS
  Filled 2021-12-24: qty 2

## 2021-12-24 MED ORDER — HYDROMORPHONE HCL 1 MG/ML IJ SOLN
1.0000 mg | Freq: Once | INTRAMUSCULAR | Status: AC
  Administered 2021-12-24: 1 mg via INTRAVENOUS
  Filled 2021-12-24: qty 1

## 2021-12-24 MED ORDER — TAMSULOSIN HCL 0.4 MG PO CAPS: 0.4000 mg | ORAL_CAPSULE | Freq: Every day | ORAL | 0 refills | Status: AC

## 2021-12-24 NOTE — ED Provider Notes (Signed)
[facility]-EMERGENCY DEPT Provider Note   CSN: [id number] Arrival date & time: 12/24/21  1022     History  Chief Complaint  Patient presents with   Flank Pain    Kevin Hughes is a 36 y.o. male with a past medical history of lumbago presenting with acute onset left flank pain.  Reports he has a history of kidney stones and this feels the same.  Endorses nausea but no vomiting.  No dysuria or hematuria.  No fevers or diaphoresis   Flank Pain       Home Medications Prior to Admission medications   Medication Sig Start Date End Date Taking? Authorizing Provider  albuterol (VENTOLIN HFA) 108 (90 Base) MCG/ACT inhaler TAKE 2 PUFFS BY MOUTH EVERY 6 HOURS AS NEEDED FOR WHEEZE OR SHORTNESS OF BREATH 12/17/21 12/17/22  Mechele Claude, MD  amoxicillin-clavulanate (AUGMENTIN) 875-125 MG tablet Take 1 tablet by mouth 2 (two) times daily. 12/10/21   Margaretann Loveless, PA-C  cetirizine (ZYRTEC) 10 MG tablet Take 10 mg by mouth daily.    [provider]  chlorhexidine (HIBICLENS) 4 % external liquid Apply topically daily as needed. Patient not taking: Reported on 08/03/2021 10/31/20   Mechele Claude, MD  clotrimazole-betamethasone (LOTRISONE) cream Apply 1 Application topically daily. 11/22/21   Waldon Merl, PA-C  cyclobenzaprine (FLEXERIL) 10 MG tablet Take 1 tablet by mouth 3 times daily as needed for muscle spasms. 11/13/21   Mechele Claude, MD  famotidine (PEPCID) 10 MG tablet Take 1 tablet (10 mg total) by mouth 2 (two) times daily. 08/30/20   Dettinger, Elige Radon, MD  fluconazole (DIFLUCAN) 150 MG tablet Take 1 tablet (150 mg total) by mouth once a week. 11/22/21   Waldon Merl, PA-C  fluticasone [facility]) 50 MCG/ACT nasal spray SPRAY 2 SPRAYS INTO EACH NOSTRIL EVERY DAY 03/01/19   Mechele Claude, MD  metroNIDAZOLE (METROGEL) 1 % gel Apply topically daily for roscea. 11/13/21   Mechele Claude, MD  rizatriptan (MAXALT-MLT) 10 MG disintegrating tablet Take 1  tablet by mouth as needed for migraine may repeat in 2 hours if needed 11/13/21   Mechele Claude, MD  Vitamin D, Ergocalciferol, (DRISDOL) 1.25 MG (50000 UNIT) CAPS capsule Take 1 capsule (50,000 Units total) by mouth every 7 (seven) days. 11/13/21   Mechele Claude, MD      Allergies    Patient has no known allergies.    Review of Systems   Review of Systems  Constitutional:  Negative for chills and fever.  Gastrointestinal:  Negative for nausea and vomiting.  Genitourinary:  Positive for flank pain and hematuria. Negative for dysuria.    Physical Exam Updated Vital Signs BP (!) 141/107 (BP Location: Right Arm)   Pulse 72   Temp 98.4 F (36.9 C) (Oral)   Resp 18   Ht 5\' 11"  (1.803 m)   Wt 88.5 kg   SpO2 98%   BMI 27.20 kg/m  Physical Exam Vitals and nursing note reviewed.  Constitutional:      Appearance: Normal appearance.  HENT:     Head: Normocephalic and atraumatic.  Eyes:     General: No scleral icterus.    Conjunctiva/sclera: Conjunctivae normal.  Pulmonary:     Effort: Pulmonary effort is normal. No respiratory distress.  Abdominal:     General: Abdomen is flat.     Palpations: Abdomen is soft.     Tenderness: There is no abdominal tenderness. There is left CVA tenderness. There is no right CVA tenderness.  Skin:    General: Skin is warm and dry.     Findings: No rash.  Neurological:     Mental Status: He is alert.  Psychiatric:        Mood and Affect: Mood normal.     ED Results / Procedures / Treatments   Labs (all labs ordered are listed, but only abnormal results are displayed) Labs Reviewed  URINALYSIS, ROUTINE W REFLEX MICROSCOPIC - Abnormal; Notable for the following components:      Result Value   Hgb urine dipstick LARGE (*)    RBC / HPF >50 (*)    Bacteria, UA RARE (*)    All other components within normal limits  BASIC METABOLIC PANEL - Abnormal; Notable for the following components:   Potassium 3.3 (*)    CO2 21 (*)    Glucose, Bld 122  (*)    BUN 21 (*)    All other components within normal limits  CBC WITH DIFFERENTIAL/PLATELET    EKG None  Radiology CT Renal Stone Study  Result Date: 12/24/2021 CLINICAL DATA:  Left flank pain EXAM: CT ABDOMEN AND PELVIS WITHOUT CONTRAST TECHNIQUE: Multidetector CT imaging of the abdomen and pelvis was performed following the standard protocol without IV contrast. RADIATION DOSE REDUCTION: This exam was performed according to the departmental dose-optimization program which includes automated exposure control, adjustment of the mA and/or kV according to patient size and/or use of iterative reconstruction technique. COMPARISON:  12/16/2015 FINDINGS: Lower chest: Unremarkable. Hepatobiliary: No focal abnormalities are seen in liver. There is no dilation of bile ducts. Gallbladder is unremarkable. Pancreas: No focal abnormalities are seen. Spleen: Spleen measures 12.2 cm in maximum diameter. Adrenals/Urinary Tract: Adrenals are unremarkable. There is no hydronephrosis. There are no renal stones. In image 84 of series 2, there is a 3 mm calcific density in the distal course of left ureter. Urinary bladder is unremarkable. Stomach/Bowel: Stomach is unremarkable. Small bowel loops are not dilated. Appendix is not dilated. There is no significant wall thickening in colon. There is no pericolic stranding. Vascular/Lymphatic: Unremarkable. Reproductive: Unremarkable. Other: There is no ascites or pneumoperitoneum. Musculoskeletal: No acute findings are seen. IMPRESSION: There is 3 mm calcific density in the distal course of left ureter close to the ureterovesical junction. There is no significant hydronephrosis. Findings may suggest distal left ureteral calculus without significant obstruction. Another less likely possibility would be vascular calcification lying immediately adjacent to the distal course of left ureter. There is no evidence of intestinal obstruction or pneumoperitoneum. Appendix is not  dilated. Electronically Signed   By: Ernie Avena M.D.   On: 12/24/2021 12:37    Procedures Procedures   Medications Ordered in ED Medications - No data to display  ED Course/ Medical Decision Making/ A&P                           Medical Decision Making Amount and/or Complexity of Data Reviewed Labs: ordered. Radiology: ordered.  Risk Prescription drug management.  36 year old male presenting with left flank pain.  Differential includes but is not limited to nephrolithiasis, pyelonephritis, muscle strain, AAA   This is not an exhaustive differential.    Past Medical History / Co-morbidities / Social History: Nephrolithiasis and back pain   Additional history: Per chart review patient had a nephrolithiasis in 2017   Physical Exam: Pertinent physical exam findings include Rolling on stretcher and apparently uncomfortable Left flank pain  Lab Tests: I ordered, and personally interpreted  labs.  The pertinent results include: Hematuria without signs of infection   Imaging Studies: I ordered and independently visualized and interpreted CT renal and I agree with the radiologist that there is a 3 mm left nephrolithiasis    Medications: I ordered medication including Dilaudid and Zofran and IVF. Reevaluation of the patient after these medicines showed that the patient resolved.    MDM/Disposition: This is a 36 year old male presenting today with left flank pain.  Found to have a 3 mm nonobstructing nephrolithiasis.  Kidney function was within normal limits.  Hematuria likely secondary to the nephrolithiasis.  No evidence of infected stone.  At this time patient will be discharged home.  No intervention is required as patient will likely pass this on his own.  He will be sent home with Percocet, 800 mg ibuprofen and Flomax.  We will also give Zofran in the case of nausea.    Final Clinical Impression(s) / ED Diagnoses Final diagnoses:  Nephrolithiasis    Rx / DC  Orders ED Discharge Orders          Ordered    ondansetron (ZOFRAN) 4 MG tablet  Every 6 hours        12/24/21 1259    oxyCODONE-acetaminophen (PERCOCET/ROXICET) 5-325 MG tablet  Every 6 hours PRN,   Status:  Discontinued        12/24/21 1259    ibuprofen (ADVIL) 800 MG tablet  3 times daily        12/24/21 1259    tamsulosin (FLOMAX) 0.4 MG CAPS capsule  Daily after breakfast        12/24/21 1259    oxyCODONE-acetaminophen (PERCOCET/ROXICET) 5-325 MG tablet  Every 6 hours PRN        12/24/21 1259           Results and diagnoses were explained to the patient. Return precautions discussed in full. Patient had no additional questions and expressed complete understanding.   This chart was dictated using voice recognition software.  Despite best efforts to proofread,  errors can occur which can change the documentation meaning.     Rhae Hammock, PA-C 12/24/21 1303    Sherwood Gambler, MD 12/27/21 [phone]

## 2021-12-24 NOTE — ED Triage Notes (Signed)
Patient reports a sudden onset of left flank pain, nausea approx 30 minutes ago.

## 2021-12-24 NOTE — Discharge Instructions (Addendum)
You have a 3 mm kidney stone on your left side.  This is something you likely will be able to pass on your own.  He will be discharged with a strainer and some medication to try and help you pass it.  The medication is called tamsulosin or Flomax.  Take this as prescribed.  Percocet is the pain medication I am sending you home with.  This is a controlled substance to keep it out of the reach of other people.  Also do not drive on this medication.  Know that it may constipate you as well.  Ultimately, if you are still having discomfort in 1 to 2 weeks, make an appointment with Dr. Louis Meckel, the urologist.  Return with any fevers, chills or inability to urinate.  It was a pleasure to meet you and we hope you feel better!

## 2021-12-25 ENCOUNTER — Telehealth: Payer: Self-pay

## 2021-12-25 NOTE — Telephone Encounter (Signed)
Transition Care Management Unsuccessful Follow-up Telephone Call  Date of discharge and from where:  12/24/2021 [facility]   Attempts:  1st Attempt  Reason for unsuccessful TCM follow-up call:  Left voice message

## 2021-12-26 NOTE — Telephone Encounter (Signed)
Transition Care Management Unsuccessful Follow-up Telephone Call  Date of discharge and from where:  12/24/2021 [facility]   Attempts:  2nd Attempt  Reason for unsuccessful TCM follow-up call:  Left voice message

## 2021-12-27 NOTE — Telephone Encounter (Signed)
Transition Care Management Unsuccessful Follow-up Telephone Call  Date of discharge and from where:  12/24/2021  Attempts:  3rd Attempt  Reason for unsuccessful TCM follow-up call:  Left voice message - letter mailed

## 2021-12-31 DIAGNOSIS — G479 Sleep disorder, unspecified: Secondary | ICD-10-CM | POA: Diagnosis not present

## 2021-12-31 DIAGNOSIS — R0683 Snoring: Secondary | ICD-10-CM | POA: Diagnosis not present

## 2022-01-11 DIAGNOSIS — G4733 Obstructive sleep apnea (adult) (pediatric): Secondary | ICD-10-CM | POA: Diagnosis not present

## 2022-02-20 DIAGNOSIS — G4733 Obstructive sleep apnea (adult) (pediatric): Secondary | ICD-10-CM | POA: Diagnosis not present

## 2022-07-05 ENCOUNTER — Encounter: Payer: Self-pay | Admitting: Family Medicine

## 2022-07-08 MED ORDER — VITAMIN D (ERGOCALCIFEROL) 1.25 MG (50000 UNIT) PO CAPS: 50000.0000 [IU] | ORAL_CAPSULE | ORAL | 1 refills | Status: DC

## 2022-07-30 ENCOUNTER — Telehealth: Payer: BC Managed Care – PPO | Admitting: Nurse Practitioner

## 2022-07-30 DIAGNOSIS — J0140 Acute pansinusitis, unspecified: Secondary | ICD-10-CM | POA: Diagnosis not present

## 2022-07-30 MED ORDER — AMOXICILLIN-POT CLAVULANATE 875-125 MG PO TABS: 1.0000 | ORAL_TABLET | Freq: Two times a day (BID) | ORAL | 0 refills | Status: AC

## 2022-07-30 MED ORDER — DOXYCYCLINE HYCLATE 100 MG PO TABS: 100.0000 mg | ORAL_TABLET | Freq: Two times a day (BID) | ORAL | 0 refills | Status: DC

## 2022-07-30 NOTE — Progress Notes (Signed)
E-Visit for Sinus Problems  We are sorry that you are not feeling well.  Here is how we plan to help!  Based on what you have shared with me it looks like you have sinusitis.  Sinusitis is inflammation and infection in the sinus cavities of the head.  Based on your presentation I believe you most likely have Acute Bacterial Sinusitis.  This is an infection caused by bacteria and is treated with antibiotics. I have prescribed Doxycycline 100mg by mouth twice a day for 10 days. You may use an oral decongestant such as Mucinex D or if you have glaucoma or high blood pressure use plain Mucinex. Saline nasal spray help and can safely be used as often as needed for congestion.  If you develop worsening sinus pain, fever or notice severe headache and vision changes, or if symptoms are not better after completion of antibiotic, please schedule an appointment with a health care provider.    Sinus infections are not as easily transmitted as other respiratory infection, however we still recommend that you avoid close contact with loved ones, especially the very young and elderly.  Remember to wash your hands thoroughly throughout the day as this is the number one way to prevent the spread of infection!  Home Care: Only take medications as instructed by your medical team. Complete the entire course of an antibiotic. Do not take these medications with alcohol. A steam or ultrasonic humidifier can help congestion.  You can place a towel over your head and breathe in the steam from hot water coming from a faucet. Avoid close contacts especially the very young and the elderly. Cover your mouth when you cough or sneeze. Always remember to wash your hands.  Get Help Right Away If: You develop worsening fever or sinus pain. You develop a severe head ache or visual changes. Your symptoms persist after you have completed your treatment plan.  Make sure you Understand these instructions. Will watch your  condition. Will get help right away if you are not doing well or get worse.  Thank you for choosing an e-visit.  Your e-visit answers were reviewed by a board certified advanced clinical practitioner to complete your personal care plan. Depending upon the condition, your plan could have included both over the counter or prescription medications.  Please review your pharmacy choice. Make sure the pharmacy is open so you can pick up prescription now. If there is a problem, you may contact your provider through MyChart messaging and have the prescription routed to another pharmacy.  Your safety is important to us. If you have drug allergies check your prescription carefully.   For the next 24 hours you can use MyChart to ask questions about today's visit, request a non-urgent call back, or ask for a work or school excuse. You will get an email in the next two days asking about your experience. I hope that your e-visit has been valuable and will speed your recovery.   Meds ordered this encounter  Medications   doxycycline (VIBRA-TABS) 100 MG tablet    Sig: Take 1 tablet (100 mg total) by mouth 2 (two) times daily for 10 days.    Dispense:  20 tablet    Refill:  0    I spent approximately 5 minutes reviewing the patient's history, current symptoms and coordinating their care today.   

## 2022-07-30 NOTE — Progress Notes (Signed)
Switched to Augmentin at patient request   Meds ordered this encounter  Medications   DISCONTD: doxycycline (VIBRA-TABS) 100 MG tablet    Sig: Take 1 tablet (100 mg total) by mouth 2 (two) times daily for 10 days.    Dispense:  20 tablet    Refill:  0   amoxicillin-clavulanate (AUGMENTIN) 875-125 MG tablet    Sig: Take 1 tablet by mouth 2 (two) times daily for 7 days.    Dispense:  14 tablet    Refill:  0

## 2022-07-30 NOTE — Addendum Note (Signed)
Addended by: Viviano Simas E on: 07/30/2022 01:18 PM   Modules accepted: Orders

## 2022-11-18 ENCOUNTER — Encounter: Payer: BC Managed Care – PPO | Admitting: Family Medicine

## 2022-11-19 ENCOUNTER — Encounter: Payer: Self-pay | Admitting: Family Medicine

## 2022-12-05 ENCOUNTER — Telehealth: Payer: Self-pay | Admitting: Physician Assistant

## 2022-12-05 DIAGNOSIS — B9689 Other specified bacterial agents as the cause of diseases classified elsewhere: Secondary | ICD-10-CM

## 2022-12-05 DIAGNOSIS — J019 Acute sinusitis, unspecified: Secondary | ICD-10-CM

## 2022-12-05 MED ORDER — AMOXICILLIN-POT CLAVULANATE 875-125 MG PO TABS: 1.0000 | ORAL_TABLET | Freq: Two times a day (BID) | ORAL | 0 refills | Status: DC

## 2022-12-05 NOTE — Progress Notes (Signed)
I have spent 5 minutes in review of e-visit questionnaire, review and updating patient chart, medical decision making and response to patient.   Mia Milan Cody Jacklynn Dehaas, PA-C    

## 2022-12-05 NOTE — Progress Notes (Signed)

## 2023-01-17 ENCOUNTER — Encounter: Payer: Self-pay | Admitting: Family Medicine

## 2023-01-17 ENCOUNTER — Ambulatory Visit: Payer: BC Managed Care – PPO | Admitting: Family Medicine

## 2023-01-17 VITALS — BP 123/81 | HR 65 | Temp 98.5°F | Ht 71.0 in | Wt 209.0 lb

## 2023-01-17 DIAGNOSIS — G5603 Carpal tunnel syndrome, bilateral upper limbs: Secondary | ICD-10-CM | POA: Diagnosis not present

## 2023-01-17 MED ORDER — PREDNISONE 20 MG PO TABS: 40.0000 mg | ORAL_TABLET | Freq: Every day | ORAL | 0 refills | Status: AC

## 2023-01-17 NOTE — Progress Notes (Signed)
Subjective:  Patient ID: Kevin Hughes, male    DOB: 07/03/1985, 37 y.o.   MRN: [id number]  Patient Care Team: Mechele Claude, MD as PCP - General (Family Medicine)   Chief Complaint:  bilateral wrist pain  HPI: Kevin Hughes is a 37 y.o. male presenting on 01/17/2023 for bilateral wrist pain Reports bilateral wrist pain. States that they feel sore "like they are bruised".  Notices it more when they are bent back "like when doing a pushup". Reports that he folds his wrists in while sleeping. Started a couple weeks ago. Has tried ibuprofen and tylenol, which helped some. Denies numbness and tingling. Tried ice and heat, which helped some. States that his wife has carpal tunnel and he started wearing her wrist brace. Believes wrist brace helped some. States that she also had leftover prednisone and he tried one dose, which helped some. Works as Education officer, community.   Relevant past medical, surgical, family, and social history reviewed and updated as indicated.  Allergies and medications reviewed and updated. Data reviewed: Chart in Epic.   Past Medical History:  Diagnosis Date   H/O seasonal allergies    Migraine    Migraines    Renal disorder     Past Surgical History:  Procedure Laterality Date   FINGER SURGERY     NASAL SEPTOPLASTY W/ TURBINOPLASTY Bilateral 12/17/2019   Procedure: NASAL SEPTOPLASTY WITH TURBINATE REDUCTION;  Surgeon: Newman Pies, MD;  Location: [facility];  Service: ENT;  Laterality: Bilateral;   WISDOM TOOTH EXTRACTION      Social History   Socioeconomic History   Marital status: Married    Spouse name: Blake Divine   Number of children: 1   Years of education: Not on file   Highest education level: Bachelor's degree (e.g., BA, AB, BS)  Occupational History   Not on file  Tobacco Use   Smoking status: Never   Smokeless tobacco: Never  Vaping Use   Vaping status: Never Used  Substance and Sexual Activity   Alcohol use: No   Drug use: No   Sexual  activity: Not on file  Other Topics Concern   Not on file  Social History Narrative   ** Merged History Encounter **    Lives at home with wife, 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   Caffeine- 2 sodas daily   Social Determinants of Health   Financial Resource Strain: Not on file  Food Insecurity: Not on file  Transportation Needs: Not on file  Physical Activity: Not on file  Stress: Not on file  Social Connections: Unknown (12/04/2021)   Received from [facility], [facility]   Social Network    Social Network: Not on file  Intimate Partner Violence: Unknown (12/04/2021)   Received from [facility], [facility]   HITS    Physically Hurt: Not on file    Insult or Talk Down To: Not on file    Threaten Physical Harm: Not on file    Scream or Curse: Not on file    Outpatient Encounter Medications as of 01/17/2023  Medication Sig   cetirizine (ZYRTEC) 10 MG tablet Take 10 mg by mouth daily.   cyclobenzaprine (FLEXERIL) 10 MG tablet Take 1 tablet by mouth 3 times daily as needed for muscle spasms.   famotidine (PEPCID) 10 MG tablet Take 1 tablet (10 mg total) by mouth 2 (two) times daily.   ibuprofen (ADVIL) 800 MG tablet Take  1 tablet (800 mg total) by mouth 3 (three) times daily.   metroNIDAZOLE (METROGEL) 1 % gel Apply topically daily for roscea.   ondansetron (ZOFRAN) 4 MG tablet Take 1 tablet (4 mg total) by mouth every 6 (six) hours.   rizatriptan (MAXALT-MLT) 10 MG disintegrating tablet Take 1 tablet by mouth as needed for migraine may repeat in 2 hours if needed   Vitamin D, Ergocalciferol, (DRISDOL) 1.25 MG (50000 UNIT) CAPS capsule Take 1 capsule (50,000 Units total) by mouth every 7 (seven) days.   [DISCONTINUED] albuterol (VENTOLIN HFA) 108 (90 Base) MCG/ACT inhaler TAKE 2 PUFFS BY MOUTH EVERY 6 HOURS AS NEEDED FOR WHEEZE OR SHORTNESS OF BREATH   [DISCONTINUED] amoxicillin-clavulanate (AUGMENTIN) 875-125 MG tablet Take 1 tablet by  mouth 2 (two) times daily.   [DISCONTINUED] chlorhexidine (HIBICLENS) 4 % external liquid Apply topically daily as needed. (Patient not taking: Reported on 08/03/2021)   [DISCONTINUED] clotrimazole-betamethasone (LOTRISONE) cream Apply 1 Application topically daily.   [DISCONTINUED] fluconazole (DIFLUCAN) 150 MG tablet Take 1 tablet (150 mg total) by mouth once a week.   [DISCONTINUED] fluticasone (FLONASE) 50 MCG/ACT nasal spray SPRAY 2 SPRAYS INTO EACH NOSTRIL EVERY DAY   [DISCONTINUED] oxyCODONE-acetaminophen (PERCOCET/ROXICET) 5-325 MG tablet Take 1 tablet by mouth every 6 (six) hours as needed for severe pain.   No facility-administered encounter medications on file as of 01/17/2023.    No Known Allergies  Review of Systems As per HPI  Objective:  BP 123/81   Pulse 65   Temp 98.5 F (36.9 C)   Ht 5\' 11"  (1.803 m)   Wt 209 lb (94.8 kg)   SpO2 98%   BMI 29.15 kg/m    Wt Readings from Last 3 Encounters:  12/24/21 195 lb (88.5 kg)  11/13/21 198 lb (89.8 kg)  08/03/21 195 lb 8 oz (88.7 kg)    Physical Exam Constitutional:      General: He is awake. He is not in acute distress.    Appearance: Normal appearance. He is well-developed and well-groomed. He is not ill-appearing, toxic-appearing or diaphoretic.  Cardiovascular:     Rate and Rhythm: Normal rate and regular rhythm.     Pulses: Normal pulses.          Radial pulses are 2+ on the right side and 2+ on the left side.       Posterior tibial pulses are 2+ on the right side and 2+ on the left side.     Heart sounds: Normal heart sounds. No murmur heard.    No gallop.  Pulmonary:     Effort: Pulmonary effort is normal. No respiratory distress.     Breath sounds: Normal breath sounds. No stridor. No wheezing, rhonchi or rales.  Musculoskeletal:     Right wrist: Tenderness present. No swelling, deformity, effusion, lacerations, bony tenderness, snuff box tenderness or crepitus. Normal range of motion. Normal pulse.     Left  wrist: Tenderness present. No swelling, deformity, effusion, lacerations, bony tenderness, snuff box tenderness or crepitus. Normal range of motion. Normal pulse.     Cervical back: Full passive range of motion without pain and neck supple.     Right lower leg: No edema.     Left lower leg: No edema.     Comments: Positive Phalen test.  Positive Tinel sign on left hand, negative on right.   Skin:    General: Skin is warm.     Capillary Refill: Capillary refill takes less than 2 seconds.  Neurological:  General: No focal deficit present.     Mental Status: He is alert, oriented to person, place, and time and easily aroused. Mental status is at baseline.     GCS: GCS eye subscore is 4. GCS verbal subscore is 5. GCS motor subscore is 6.     Motor: No weakness.  Psychiatric:        Attention and Perception: Attention and perception normal.        Mood and Affect: Mood and affect normal.        Speech: Speech normal.        Behavior: Behavior normal. Behavior is cooperative.        Thought Content: Thought content normal. Thought content does not include homicidal or suicidal ideation. Thought content does not include homicidal or suicidal plan.        Cognition and Memory: Cognition and memory normal.        Judgment: Judgment normal.     Results for orders placed or performed during the hospital encounter of 12/24/21  Urinalysis, Routine w reflex microscopic Urine, Clean Catch  Result Value Ref Range   Color, Urine YELLOW YELLOW   APPearance CLEAR CLEAR   Specific Gravity, Urine 1.024 1.005 - 1.030   pH 5.0 5.0 - 8.0   Glucose, UA NEGATIVE NEGATIVE mg/dL   Hgb urine dipstick LARGE (A) NEGATIVE   Bilirubin Urine NEGATIVE NEGATIVE   Ketones, ur NEGATIVE NEGATIVE mg/dL   Protein, ur NEGATIVE NEGATIVE mg/dL   Nitrite NEGATIVE NEGATIVE   Leukocytes,Ua NEGATIVE NEGATIVE   RBC / HPF >50 (H) 0 - 5 RBC/hpf   WBC, UA 0-5 0 - 5 WBC/hpf   Bacteria, UA RARE (A) NONE SEEN   Mucus PRESENT     Budding Yeast PRESENT   CBC with Differential  Result Value Ref Range   WBC 7.4 4.0 - 10.5 K/uL   RBC 5.04 4.22 - 5.81 MIL/uL   Hemoglobin 15.2 13.0 - 17.0 g/dL   HCT 21.3 08.6 - 57.8 %   MCV 86.7 80.0 - 100.0 fL   MCH 30.2 26.0 - 34.0 pg   MCHC 34.8 30.0 - 36.0 g/dL   RDW 46.9 62.9 - 52.8 %   Platelets 317 150 - 400 K/uL   nRBC 0.0 0.0 - 0.2 %   Neutrophils Relative % 66 %   Neutro Abs 4.7 1.7 - 7.7 K/uL   Lymphocytes Relative 24 %   Lymphs Abs 1.8 0.7 - 4.0 K/uL   Monocytes Relative 9 %   Monocytes Absolute 0.7 0.1 - 1.0 K/uL   Eosinophils Relative 1 %   Eosinophils Absolute 0.1 0.0 - 0.5 K/uL   Basophils Relative 0 %   Basophils Absolute 0.0 0.0 - 0.1 K/uL   Immature Granulocytes 0 %   Abs Immature Granulocytes 0.03 0.00 - 0.07 K/uL  Basic metabolic panel  Result Value Ref Range   Sodium 138 135 - 145 mmol/L   Potassium 3.3 (L) 3.5 - 5.1 mmol/L   Chloride 109 98 - 111 mmol/L   CO2 21 (L) 22 - 32 mmol/L   Glucose, Bld 122 (H) 70 - 99 mg/dL   BUN 21 (H) 6 - 20 mg/dL   Creatinine, Ser 4.13 0.61 - 1.24 mg/dL   Calcium 8.9 8.9 - 24.4 mg/dL   GFR, Estimated >01 >02 mL/min   Anion gap 8 5 - 15       11/13/2021    8:06 AM 08/03/2021    4:23 PM 10/31/2020  4:24 PM 09/20/2020    4:43 PM 08/30/2020   10:37 AM  Depression screen PHQ 2/9  Decreased Interest 0 0 0 0 0  Down, Depressed, Hopeless 0 0 0 0 0  PHQ - 2 Score 0 0 0 0 0  Altered sleeping  0  0   Tired, decreased energy  0  0   Change in appetite  0  0   Feeling bad or failure about yourself   0  0   Trouble concentrating  0  0   Moving slowly or fidgety/restless  0  0   Suicidal thoughts  0  0   PHQ-9 Score  0  0   Difficult doing work/chores  Not difficult at all  Not difficult at all        08/03/2021    4:23 PM 09/20/2020    4:43 PM  GAD 7 : Generalized Anxiety Score  Nervous, Anxious, on Edge 0 0  Control/stop worrying 0 0  Worry too much - different things 0 0  Trouble relaxing 0 0  Restless 0 0   Easily annoyed or irritable 0 0  Afraid - awful might happen 0 0  Total GAD 7 Score 0 0  Anxiety Difficulty Not difficult at all Not difficult at all   Pertinent labs & imaging results that were available during my care of the patient were reviewed by me and considered in my medical decision making.  Assessment & Plan:  Makaveli was seen today for bilateral wrist pain.  Diagnoses and all orders for this visit:  Bilateral carpal tunnel syndrome Will start medication as below. Discussed with patient wrist braces for sleep and during work. Patient verbalized he will purchase.  -     predniSONE (DELTASONE) 20 MG tablet; Take 2 tablets (40 mg total) by mouth daily with breakfast for 5 days.  Continue all other maintenance medications.  Follow up plan: Return if symptoms worsen or fail to improve.   Continue healthy lifestyle choices, including diet (rich in fruits, vegetables, and lean proteins, and low in salt and simple carbohydrates) and exercise (at least 30 minutes of moderate physical activity daily).  Written and verbal instructions provided   The above assessment and management plan was discussed with the patient. The patient verbalized understanding of and has agreed to the management plan. Patient is aware to call the clinic if they develop any new symptoms or if symptoms persist or worsen. Patient is aware when to return to the clinic for a follow-up visit. Patient educated on when it is appropriate to go to the emergency department.   Neale Burly, DNP-FNP [facility] [address] [place], [place] [address] [phone]

## 2023-02-06 ENCOUNTER — Telehealth: Payer: BC Managed Care – PPO | Admitting: Emergency Medicine

## 2023-02-06 DIAGNOSIS — J329 Chronic sinusitis, unspecified: Secondary | ICD-10-CM

## 2023-02-06 MED ORDER — AMOXICILLIN-POT CLAVULANATE 875-125 MG PO TABS: 1.0000 | ORAL_TABLET | Freq: Two times a day (BID) | ORAL | 0 refills | Status: DC

## 2023-02-06 NOTE — Progress Notes (Signed)
E-Visit for Sinus Problems  We are sorry that you are not feeling well.  Here is how we plan to help!  Based on what you have shared with me it looks like you have sinusitis.  Sinusitis is inflammation and infection in the sinus cavities of the head.  Based on your presentation I believe you most likely have Acute Bacterial Sinusitis.  This is an infection caused by bacteria and is treated with antibiotics. I have prescribed Augmentin 875mg/125mg one tablet twice daily with food, for 7 days. You may use an oral decongestant such as Mucinex D or if you have glaucoma or high blood pressure use plain Mucinex. Saline nasal spray help and can safely be used as often as needed for congestion.  If you develop worsening sinus pain, fever or notice severe headache and vision changes, or if symptoms are not better after completion of antibiotic, please schedule an appointment with a health care provider.    Sinus infections are not as easily transmitted as other respiratory infection, however we still recommend that you avoid close contact with loved ones, especially the very young and elderly.  Remember to wash your hands thoroughly throughout the day as this is the number one way to prevent the spread of infection!  Home Care: Only take medications as instructed by your medical team. Complete the entire course of an antibiotic. Do not take these medications with alcohol. A steam or ultrasonic humidifier can help congestion.  You can place a towel over your head and breathe in the steam from hot water coming from a faucet. Avoid close contacts especially the very young and the elderly. Cover your mouth when you cough or sneeze. Always remember to wash your hands.  Get Help Right Away If: You develop worsening fever or sinus pain. You develop a severe head ache or visual changes. Your symptoms persist after you have completed your treatment plan.  Make sure you Understand these instructions. Will watch  your condition. Will get help right away if you are not doing well or get worse.  Thank you for choosing an e-visit.  Your e-visit answers were reviewed by a board certified advanced clinical practitioner to complete your personal care plan. Depending upon the condition, your plan could have included both over the counter or prescription medications.  Please review your pharmacy choice. Make sure the pharmacy is open so you can pick up prescription now. If there is a problem, you may contact your provider through MyChart messaging and have the prescription routed to another pharmacy.  Your safety is important to us. If you have drug allergies check your prescription carefully.   For the next 24 hours you can use MyChart to ask questions about today's visit, request a non-urgent call back, or ask for a work or school excuse. You will get an email in the next two days asking about your experience. I hope that your e-visit has been valuable and will speed your recovery.  Approximately 5 minutes was used in reviewing the patient's chart, questionnaire, prescribing medications, and documentation.  

## 2023-02-10 ENCOUNTER — Encounter: Payer: Self-pay | Admitting: Family Medicine

## 2023-02-11 MED ORDER — VITAMIN D (ERGOCALCIFEROL) 1.25 MG (50000 UNIT) PO CAPS: 50000.0000 [IU] | ORAL_CAPSULE | ORAL | 1 refills | Status: DC

## 2023-03-10 ENCOUNTER — Encounter: Payer: Self-pay | Admitting: Family Medicine

## 2023-03-10 ENCOUNTER — Ambulatory Visit (INDEPENDENT_AMBULATORY_CARE_PROVIDER_SITE_OTHER): Payer: BC Managed Care – PPO | Admitting: Family Medicine

## 2023-03-10 VITALS — BP 120/84 | HR 77 | Temp 97.4°F | Ht 71.0 in | Wt 208.8 lb

## 2023-03-10 DIAGNOSIS — Z0001 Encounter for general adult medical examination with abnormal findings: Secondary | ICD-10-CM

## 2023-03-10 DIAGNOSIS — R6882 Decreased libido: Secondary | ICD-10-CM

## 2023-03-10 DIAGNOSIS — Z0000 Encounter for general adult medical examination without abnormal findings: Secondary | ICD-10-CM

## 2023-03-10 LAB — URINALYSIS
Bilirubin, UA: NEGATIVE
Glucose, UA: NEGATIVE
Ketones, UA: NEGATIVE
Leukocytes,UA: NEGATIVE
Nitrite, UA: NEGATIVE
Protein,UA: NEGATIVE
RBC, UA: NEGATIVE
Specific Gravity, UA: 1.02 (ref 1.005–1.030)
Urobilinogen, Ur: 0.2 mg/dL (ref 0.2–1.0)
pH, UA: 7.5 (ref 5.0–7.5)

## 2023-03-10 NOTE — Patient Instructions (Signed)

## 2023-03-10 NOTE — Progress Notes (Signed)
Subjective:  Patient ID: Kevin Hughes, male    DOB: 1985-04-25  Age: 37 y.o. MRN: [id number]  CC: Annual Exam   HPI Kevin Hughes presents for Annual exam     03/10/2023    8:53 AM 11/13/2021    8:06 AM 08/03/2021    4:23 PM  Depression screen PHQ 2/9  Decreased Interest 0 0 0  Down, Depressed, Hopeless 0 0 0  PHQ - 2 Score 0 0 0  Altered sleeping   0  Tired, decreased energy   0  Change in appetite   0  Feeling bad or failure about yourself    0  Trouble concentrating   0  Moving slowly or fidgety/restless   0  Suicidal thoughts   0  PHQ-9 Score   0  Difficult doing work/chores   Not difficult at all    History Kevin Hughes has a past medical history of H/O seasonal allergies, Migraine, Migraines, and Renal disorder.   He has a past surgical history that includes Wisdom tooth extraction; Finger surgery; and Nasal septoplasty w/ turbinoplasty (Bilateral, 12/17/2019).   His family history includes Dementia in his paternal grandmother; Diabetes in his paternal grandfather; Hypertension in his mother; Throat cancer in his paternal grandfather.He reports that he has never smoked. He has never used smokeless tobacco. He reports that he does not drink alcohol and does not use drugs.    ROS Review of Systems  Constitutional:  Negative for activity change, fatigue, fever and unexpected weight change.  HENT:  Negative for congestion, ear pain, hearing loss, postnasal drip and trouble swallowing.   Eyes:  Negative for pain and visual disturbance.  Respiratory:  Negative for cough, chest tightness and shortness of breath.   Cardiovascular:  Negative for chest pain, palpitations and leg swelling.  Gastrointestinal:  Negative for abdominal distention, abdominal pain, blood in stool, constipation, diarrhea, nausea and vomiting.  Endocrine: Negative for cold intolerance, heat intolerance and polydipsia.  Genitourinary:  Negative for difficulty urinating, dysuria, flank pain, frequency and  urgency.       Occasional decrease in desire and also of firmness ofr erection.   Musculoskeletal:  Positive for arthralgias (wrist pains have resolved.) and back pain (occasional, midline lumbar). Negative for joint swelling.  Skin:  Negative for color change, rash and wound.  Neurological:  Negative for dizziness, syncope, speech difficulty, weakness, light-headedness, numbness and headaches.  Hematological:  Does not bruise/bleed easily.  Psychiatric/Behavioral:  Negative for confusion, decreased concentration, dysphoric mood and sleep disturbance. The patient is not nervous/anxious.     Objective:  BP 120/84   Pulse 77   Temp (!) 97.4 F (36.3 C)   Ht 5\' 11"  (1.803 m)   Wt 208 lb 12.8 oz (94.7 kg)   SpO2 97%   BMI 29.12 kg/m   BP Readings from Last 3 Encounters:  03/10/23 120/84  01/17/23 123/81  12/24/21 124/81    Wt Readings from Last 3 Encounters:  03/10/23 208 lb 12.8 oz (94.7 kg)  01/17/23 209 lb (94.8 kg)  12/24/21 195 lb (88.5 kg)     Physical Exam Constitutional:      Appearance: He is well-developed.  HENT:     Head: Normocephalic and atraumatic.  Eyes:     Pupils: Pupils are equal, round, and reactive to light.  Neck:     Thyroid: No thyromegaly.     Trachea: No tracheal deviation.  Cardiovascular:     Rate and Rhythm: Normal rate and regular rhythm.  Heart sounds: Normal heart sounds. No murmur heard.    No friction rub. No gallop.  Pulmonary:     Breath sounds: Normal breath sounds. No wheezing or rales.  Abdominal:     General: Bowel sounds are normal. There is no distension.     Palpations: Abdomen is soft. There is no mass.     Tenderness: There is no abdominal tenderness.     Hernia: There is no hernia in the left inguinal area.  Genitourinary:    Penis: Normal.      Testes: Normal.  Musculoskeletal:        General: Normal range of motion.     Cervical back: Normal range of motion.  Lymphadenopathy:     Cervical: No cervical  adenopathy.  Skin:    General: Skin is warm and dry.  Neurological:     Mental Status: He is alert and oriented to person, place, and time.       Assessment & Plan:   Kevin Hughes was seen today for annual exam.  Diagnoses and all orders for this visit:  Well adult exam -     CBC with Differential/Platelet -     CMP14+EGFR -     Lipid panel -     Urinalysis -     VITAMIN D 25 Hydroxy (Vit-D Deficiency, Fractures)  Libido, decreased -     Testosterone,Free and Total       I have discontinued Ibn Vaden's ibuprofen and amoxicillin-clavulanate. I am also having him maintain his cetirizine, famotidine, metroNIDAZOLE, rizatriptan, cyclobenzaprine, ondansetron, and Vitamin D (Ergocalciferol).  Allergies as of 03/10/2023   No Known Allergies      Medication List        Accurate as of March 10, 2023  9:37 AM. If you have any questions, ask your nurse or doctor.          STOP taking these medications    amoxicillin-clavulanate 875-125 MG tablet Commonly known as: AUGMENTIN Stopped by: Chrisette Man   ibuprofen 800 MG tablet Commonly known as: ADVIL Stopped by: Tresean Mattix       TAKE these medications    cetirizine 10 MG tablet Commonly known as: ZYRTEC Take 10 mg by mouth daily.   cyclobenzaprine 10 MG tablet Commonly known as: FLEXERIL Take 1 tablet by mouth 3 times daily as needed for muscle spasms.   famotidine 10 MG tablet Commonly known as: PEPCID Take 1 tablet (10 mg total) by mouth 2 (two) times daily.   metroNIDAZOLE 1 % gel Commonly known as: Metrogel Apply topically daily for roscea.   ondansetron 4 MG tablet Commonly known as: ZOFRAN Take 1 tablet (4 mg total) by mouth every 6 (six) hours.   rizatriptan 10 MG disintegrating tablet Commonly known as: MAXALT-MLT Take 1 tablet by mouth as needed for migraine may repeat in 2 hours if needed   Vitamin D (Ergocalciferol) 1.25 MG (50000 UNIT) Caps capsule Commonly known as:  DRISDOL Take 1 capsule (50,000 Units total) by mouth every 7 (seven) days.         Follow-up: Return in about 1 year (around 03/09/2024) for [facility] physical.  Mechele Claude, M.D.

## 2023-03-11 LAB — CMP14+EGFR
ALT: 32 [IU]/L (ref 0–44)
AST: 24 [IU]/L (ref 0–40)
Albumin: 4.4 g/dL (ref 4.1–5.1)
Alkaline Phosphatase: 90 [IU]/L (ref 44–121)
BUN/Creatinine Ratio: 11 (ref 9–20)
BUN: 12 mg/dL (ref 6–20)
Bilirubin Total: 0.4 mg/dL (ref 0.0–1.2)
CO2: 25 mmol/L (ref 20–29)
Calcium: 9.2 mg/dL (ref 8.7–10.2)
Chloride: 104 mmol/L (ref 96–106)
Creatinine, Ser: 1.05 mg/dL (ref 0.76–1.27)
Globulin, Total: 2.6 g/dL (ref 1.5–4.5)
Glucose: 105 mg/dL — ABNORMAL HIGH (ref 70–99)
Potassium: 4.1 mmol/L (ref 3.5–5.2)
Sodium: 143 mmol/L (ref 134–144)
Total Protein: 7 g/dL (ref 6.0–8.5)
eGFR: 93 mL/min/{1.73_m2} (ref >59)

## 2023-03-11 LAB — CBC WITH DIFFERENTIAL/PLATELET
Basophils Absolute: 0 10*3/uL (ref 0.0–0.2)
Basos: 1 %
EOS (ABSOLUTE): 0.1 10*3/uL (ref 0.0–0.4)
Eos: 1 %
Hematocrit: 47.5 % (ref 37.5–51.0)
Hemoglobin: 15.7 g/dL (ref 13.0–17.7)
Immature Grans (Abs): 0 10*3/uL (ref 0.0–0.1)
Immature Granulocytes: 0 %
Lymphocytes Absolute: 1.8 10*3/uL (ref 0.7–3.1)
Lymphs: 29 %
MCH: 29.7 pg (ref 26.6–33.0)
MCHC: 33.1 g/dL (ref 31.5–35.7)
MCV: 90 fL (ref 79–97)
Monocytes Absolute: 0.7 10*3/uL (ref 0.1–0.9)
Monocytes: 11 %
Neutrophils Absolute: 3.5 10*3/uL (ref 1.4–7.0)
Neutrophils: 58 %
Platelets: 300 10*3/uL (ref 150–450)
RBC: 5.29 x10E6/uL (ref 4.14–5.80)
RDW: 11.9 % (ref 11.6–15.4)
WBC: 6.1 10*3/uL (ref 3.4–10.8)

## 2023-03-11 LAB — LIPID PANEL
Chol/HDL Ratio: 3.9 {ratio} (ref 0.0–5.0)
Cholesterol, Total: 185 mg/dL (ref 100–199)
HDL: 48 mg/dL (ref >39)
LDL Chol Calc (NIH): 108 mg/dL — ABNORMAL HIGH (ref 0–99)
Triglycerides: 167 mg/dL — ABNORMAL HIGH (ref 0–149)
VLDL Cholesterol Cal: 29 mg/dL (ref 5–40)

## 2023-03-11 LAB — TESTOSTERONE,FREE AND TOTAL
Testosterone, Free: 4.4 pg/mL — ABNORMAL LOW (ref 8.7–25.1)
Testosterone: 307 ng/dL (ref 264–916)

## 2023-03-11 LAB — VITAMIN D 25 HYDROXY (VIT D DEFICIENCY, FRACTURES): Vit D, 25-Hydroxy: 45.5 ng/mL (ref 30.0–100.0)

## 2023-05-26 ENCOUNTER — Encounter: Payer: Self-pay | Admitting: Family Medicine

## 2023-05-27 ENCOUNTER — Other Ambulatory Visit (HOSPITAL_COMMUNITY): Payer: Self-pay

## 2023-05-27 ENCOUNTER — Other Ambulatory Visit: Payer: Self-pay | Admitting: Family Medicine

## 2023-05-27 DIAGNOSIS — G43009 Migraine without aura, not intractable, without status migrainosus: Secondary | ICD-10-CM

## 2023-05-27 MED ORDER — CYCLOBENZAPRINE HCL 10 MG PO TABS
10.0000 mg | ORAL_TABLET | Freq: Three times a day (TID) | ORAL | 5 refills | Status: AC | PRN
  Filled 2023-05-27 – 2023-06-02 (×2): qty 30, 10d supply, fill #0
  Filled 2023-08-12: qty 30, 10d supply, fill #1
  Filled 2023-11-18: qty 30, 10d supply, fill #2

## 2023-05-27 MED ORDER — RIZATRIPTAN BENZOATE 10 MG PO TBDP
ORAL_TABLET | ORAL | 6 refills | Status: AC | PRN
  Filled 2023-05-27 – 2023-06-02 (×2): qty 9, 30d supply, fill #0
  Filled 2023-08-12: qty 9, 30d supply, fill #1
  Filled 2023-11-18: qty 9, 30d supply, fill #2
  Filled 2024-03-30: qty 9, 30d supply, fill #3

## 2023-06-02 ENCOUNTER — Other Ambulatory Visit (HOSPITAL_COMMUNITY): Payer: Self-pay

## 2023-06-03 ENCOUNTER — Other Ambulatory Visit: Payer: Self-pay

## 2023-06-03 ENCOUNTER — Other Ambulatory Visit (HOSPITAL_COMMUNITY): Payer: Self-pay

## 2023-06-04 ENCOUNTER — Other Ambulatory Visit (HOSPITAL_COMMUNITY): Payer: Self-pay

## 2023-06-06 ENCOUNTER — Telehealth: Admitting: Physician Assistant

## 2023-06-06 DIAGNOSIS — J019 Acute sinusitis, unspecified: Secondary | ICD-10-CM

## 2023-06-06 DIAGNOSIS — B9689 Other specified bacterial agents as the cause of diseases classified elsewhere: Secondary | ICD-10-CM | POA: Diagnosis not present

## 2023-06-06 MED ORDER — PSEUDOEPH-BROMPHEN-DM 30-2-10 MG/5ML PO SYRP: 5.0000 mL | ORAL_SOLUTION | Freq: Four times a day (QID) | ORAL | 0 refills | Status: DC | PRN

## 2023-06-06 MED ORDER — AMOXICILLIN-POT CLAVULANATE 875-125 MG PO TABS: 1.0000 | ORAL_TABLET | Freq: Two times a day (BID) | ORAL | 0 refills | Status: DC

## 2023-06-06 NOTE — Progress Notes (Signed)

## 2023-06-16 ENCOUNTER — Ambulatory Visit: Admitting: Family Medicine

## 2023-08-13 ENCOUNTER — Other Ambulatory Visit: Payer: Self-pay

## 2023-08-13 ENCOUNTER — Other Ambulatory Visit (HOSPITAL_COMMUNITY): Payer: Self-pay

## 2023-09-03 ENCOUNTER — Other Ambulatory Visit: Payer: Self-pay | Admitting: *Deleted

## 2023-09-03 ENCOUNTER — Other Ambulatory Visit (HOSPITAL_COMMUNITY): Payer: Self-pay

## 2023-09-03 NOTE — Telephone Encounter (Signed)
New pharmacy for pt

## 2023-09-04 ENCOUNTER — Other Ambulatory Visit (HOSPITAL_COMMUNITY): Payer: Self-pay

## 2023-09-04 MED ORDER — VITAMIN D (ERGOCALCIFEROL) 1.25 MG (50000 UNIT) PO CAPS
50000.0000 [IU] | ORAL_CAPSULE | ORAL | 3 refills | Status: AC
  Filled 2023-09-04: qty 13, 91d supply, fill #0
  Filled 2023-09-17: qty 13, 90d supply, fill #0
  Filled 2023-12-21: qty 13, 90d supply, fill #1
  Filled 2024-03-30: qty 13, 90d supply, fill #2

## 2023-09-05 ENCOUNTER — Other Ambulatory Visit (HOSPITAL_COMMUNITY): Payer: Self-pay

## 2023-09-11 ENCOUNTER — Other Ambulatory Visit (HOSPITAL_COMMUNITY): Payer: Self-pay

## 2023-09-16 ENCOUNTER — Ambulatory Visit: Admitting: Family Medicine

## 2023-09-17 ENCOUNTER — Other Ambulatory Visit (HOSPITAL_COMMUNITY): Payer: Self-pay

## 2023-09-17 ENCOUNTER — Other Ambulatory Visit: Payer: Self-pay

## 2023-09-18 ENCOUNTER — Telehealth: Admitting: Physician Assistant

## 2023-09-18 DIAGNOSIS — H60332 Swimmer's ear, left ear: Secondary | ICD-10-CM | POA: Diagnosis not present

## 2023-09-18 MED ORDER — CIPROFLOXACIN-DEXAMETHASONE 0.3-0.1 % OT SUSP: 4.0000 [drp] | Freq: Two times a day (BID) | OTIC | 0 refills | Status: AC

## 2023-09-18 NOTE — Progress Notes (Signed)
 E Visit for Ear Pain - Swimmer's Ear  We are sorry that you are not feeling well. Here is how we plan to help!  Based on what you have shared with me it looks like you have Swimmer's Ear.  Swimmer's ear is a redness or swelling, irritation, or infection of your outer ear canal. These symptoms usually occur within a few days of swimming. Your ear canal is a tube that goes from the opening of the ear to the eardrum.  When water stays in your ear canal, germs can grow.  This is a painful condition that often happens to children and swimmers of all ages.  It is not contagious and oral antibiotics are not required to treat uncomplicated swimmer's ear.  The usual symptoms include:    Itchiness inside the ear  Redness or a sense of swelling in the ear  Pain when the ear is tugged on when pressure is placed on the ear  Pus draining from the infected ear   I have prescribed: Ciprofloxacin 0.3% and dexamethasone 0.1% otic suspension four drops in affected ears two times a day for 7 days  In certain cases, swimmer's ear may progress to a more serious bacterial infection of the middle or inner ear.  If you have a fever 102 and up and significantly worsening symptoms, this could indicate a more serious infection moving to the middle/inner and needs face to face evaluation in an office by a provider.  Your symptoms should improve over the next 3 days and should resolve in about 7 days.  Be sure to complete ALL of your prescription.  HOME CARE: Wash your hands frequently. If you are prescribed an ear drop, do not place the tip of the bottle on your ear or touch it with your fingers. You can take Acetaminophen 650 mg every 4-6 hours as needed for pain.  If pain is severe or moderate, you can apply a heating pad (set on low) or hot water bottle (wrapped in a towel) to outer ear for 20 minutes.  This will also increase drainage. Avoid ear plugs Do not go swimming until the symptoms are gone Do not use  Q-tips After showers, help the water run out by tilting your head to one side.   GET HELP RIGHT AWAY IF: Fever is over 102.2 degrees. You develop progressive ear pain or hearing loss. Ear symptoms persist longer than 3 days after treatment.  MAKE SURE YOU: Understand these instructions. Will watch your condition. Will get help right away if you are not doing well or get worse.  TO PREVENT SWIMMER'S EAR: Use a bathing cap or custom fitted swim molds to keep your ears dry. Towel off after swimming to dry your ears. Tilt your head or pull your earlobes to allow the water to escape your ear canal. If there is still water in your ears, consider using a hairdryer on the lowest setting.  Thank you for choosing an e-visit.  Your e-visit answers were reviewed by a board certified advanced clinical practitioner to complete your personal care plan. Depending upon the condition, your plan could have included both over the counter or prescription medications.  Please review your pharmacy choice. Make sure the pharmacy is open so you can pick up the prescription now. If there is a problem, you may contact your provider through [employer] and have the prescription routed to another pharmacy.  Your safety is important to [place]. If you have drug allergies check your prescription carefully.  For the next 24 hours you can use MyChart to ask questions about today's visit, request a non-urgent call back, or ask for a work or school excuse. You will get an email with a survey after your eVisit asking about your experience. We would appreciate your feedback. I hope that your e-visit has been valuable and will aid in your recovery.   I have spent 5 minutes in review of e-visit questionnaire, review and updating patient chart, medical decision making and response to patient.   Margaretann Loveless, PA-C

## 2023-11-19 ENCOUNTER — Other Ambulatory Visit (HOSPITAL_COMMUNITY): Payer: Self-pay

## 2023-12-22 ENCOUNTER — Other Ambulatory Visit (HOSPITAL_COMMUNITY): Payer: Self-pay

## 2023-12-28 ENCOUNTER — Telehealth: Admitting: Family

## 2023-12-28 DIAGNOSIS — J019 Acute sinusitis, unspecified: Secondary | ICD-10-CM | POA: Diagnosis not present

## 2023-12-28 MED ORDER — AMOXICILLIN-POT CLAVULANATE 875-125 MG PO TABS: 1.0000 | ORAL_TABLET | Freq: Two times a day (BID) | ORAL | 0 refills | Status: AC

## 2023-12-28 NOTE — Progress Notes (Signed)

## 2024-01-01 ENCOUNTER — Ambulatory Visit: Admitting: Family Medicine

## 2024-01-02 ENCOUNTER — Encounter: Payer: Self-pay | Admitting: Family Medicine

## 2024-01-02 ENCOUNTER — Ambulatory Visit (INDEPENDENT_AMBULATORY_CARE_PROVIDER_SITE_OTHER): Admitting: Family Medicine

## 2024-01-02 VITALS — BP 122/79 | HR 61 | Temp 97.3°F | Ht 71.0 in | Wt 203.4 lb

## 2024-01-02 DIAGNOSIS — R6889 Other general symptoms and signs: Secondary | ICD-10-CM | POA: Diagnosis not present

## 2024-01-02 LAB — VERITOR FLU A/B WAIVED
Influenza A: NEGATIVE
Influenza B: NEGATIVE

## 2024-01-02 MED ORDER — PROMETHAZINE-DM 6.25-15 MG/5ML PO SYRP: 2.5000 mL | ORAL_SOLUTION | Freq: Four times a day (QID) | ORAL | 0 refills | Status: AC | PRN

## 2024-01-02 MED ORDER — BENZONATATE 100 MG PO CAPS: 100.0000 mg | ORAL_CAPSULE | Freq: Three times a day (TID) | ORAL | 0 refills | Status: AC | PRN

## 2024-01-02 NOTE — Patient Instructions (Addendum)
 Resume use of Flonase . Consider Pseudofed behind the counter for sinus congestion. Debrox for ear wax   It appears that you have a viral upper respiratory infection (cold).  Cold symptoms can last up to 2 weeks.    - Get plenty of rest and drink plenty of fluids. - Try to breathe moist air. Use a cold mist humidifier. - Consume warm fluids (soup or tea) to provide relief for a stuffy nose and to loosen phlegm. - For nasal stuffiness, try saline nasal spray or a Neti Pot. Afrin nasal spray can also be used but this product should not be used longer than 3 days or it will cause rebound nasal stuffiness (worsening nasal congestion). - For sore throat pain relief: use chloraseptic spray, suck on throat lozenges, hard candy or popsicles; gargle with warm salt water (1/4 tsp. salt per 8 oz. of water); and eat soft, bland foods. - Eat a well-balanced diet. If you cannot, ensure you are getting enough nutrients by taking a daily multivitamin. - Avoid dairy products, as they can thicken phlegm. - Avoid alcohol, as it impairs your body's immune system.  CONTACT YOUR DOCTOR IF YOU EXPERIENCE ANY OF THE FOLLOWING: - High fever - Ear pain - Sinus-type headache - Unusually severe cold symptoms - Cough that gets worse while other cold symptoms improve - Flare up of any chronic lung problem, such as asthma - Your symptoms persist longer than 2 weeks

## 2024-01-02 NOTE — Progress Notes (Signed)
 Subjective: CC:URI PCP: Zollie Lowers, MD YEP:Kevin Hughes is a 38 y.o. male presenting to clinic today for:  Flu like symptoms Reports onset last Saturday.  He thought it was may be exposure to some of the work particles in the air.  He reports ongoing sinus congestion, pressure, sneezing, myalgia and cough.  No hemoptysis or brown sputum.  No shortness of breath or wheezing.  No underlying lung disorder and denies any tobacco use.  He actually had a e-visit on Sunday and they gave him Augmentin  which has minimally improved his symptoms.  No nausea, vomiting. Using dayquil, saline rinses. Tested negative for COVID at home Wed. No known sick contacts.   ROS: Per HPI  No Known Allergies Past Medical History:  Diagnosis Date   H/O seasonal allergies    Migraine    Migraines    Renal disorder     Current Outpatient Medications:    amoxicillin -clavulanate (AUGMENTIN ) 875-125 MG tablet, Take 1 tablet by mouth 2 (two) times daily., Disp: 14 tablet, Rfl: 0   cetirizine  (ZYRTEC ) 10 MG tablet, Take 10 mg by mouth daily., Disp: , Rfl:    cyclobenzaprine  (FLEXERIL ) 10 MG tablet, Take 1 tablet by mouth 3 times daily as needed for muscle spasms., Disp: 30 tablet, Rfl: 5   famotidine  (PEPCID ) 10 MG tablet, Take 1 tablet (10 mg total) by mouth 2 (two) times daily., Disp: 30 tablet, Rfl: 1   ondansetron  (ZOFRAN ) 4 MG tablet, Take 1 tablet (4 mg total) by mouth every 6 (six) hours., Disp: 12 tablet, Rfl: 0   rizatriptan  (MAXALT -MLT) 10 MG disintegrating tablet, Take 1 tablet by mouth as needed for migraine may repeat in 2 hours if needed, Disp: 9 tablet, Rfl: 6   Vitamin D , Ergocalciferol , (DRISDOL ) 1.25 MG (50000 UNIT) CAPS capsule, Take 1 capsule (50,000 Units total) by mouth every 7 (seven) days., Disp: 13 capsule, Rfl: 3   metroNIDAZOLE  (METROGEL ) 1 % gel, Apply topically daily for roscea. 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, 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   Caffeine- 2 sodas daily   Social Drivers of Health   Financial Resource Strain: Low Risk  (03/09/2023)   Overall Financial Resource Strain (CARDIA)    Difficulty of Paying Living Expenses: Not hard at all  Food Insecurity: No Food Insecurity (03/09/2023)   Hunger Vital Sign    Worried About Running Out of Food in the Last Year: Never true    Ran Out of Food in the Last Year: Never true  Transportation Needs: No Transportation Needs (03/09/2023)   PRAPARE - Administrator, Civil Service (Medical): No    Lack of Transportation (Non-Medical): No  Physical Activity: Unknown (03/09/2023)   Exercise  Vital Sign    Days of Exercise per Week: 0 days    Minutes of Exercise per Session: Not on file  Stress: No Stress Concern Present (03/09/2023)   [employer] of Occupational Health - Occupational Stress Questionnaire    Feeling of Stress : Not at all  Social Connections: Moderately Isolated (03/09/2023)   Social Connection and Isolation Panel    Frequency of Communication with Friends and Family: Twice a week    Frequency of Social Gatherings with Friends and Family: Three times a week    Attends Religious Services: Never    Active Member of Clubs or Organizations: No    Attends Banker Meetings: Not on file    Marital Status: Married  Intimate Partner Violence: Unknown  (12/04/2021)   Received from [facility]   HITS    Physically Hurt: Not on file    Insult or Talk Down To: Not on file    Threaten Physical Harm: Not on file    Scream or Curse: Not on file   Family History  Problem Relation Age of Onset   Hypertension Mother    Throat cancer Paternal Grandfather    Diabetes Paternal Grandfather    Dementia Paternal Grandmother     Objective: Office vital signs reviewed. BP 122/79   Pulse 61   Temp (!) 97.3 F (36.3 C) (Temporal)   Ht 5' 11 (1.803 m)   Wt 203 lb 6.4 oz (92.3 kg)   SpO2 97%   BMI 28.37 kg/m   Physical Examination:  General: Awake, alert, well nourished, nontoxic-appearing male.  No acute distress HEENT: Normal    Neck: No masses palpated.  Mild enlargement of the anterior cervical lymph nodes    Ears: Tympanic membranes obscured by cerumen bilaterally    Eyes: PERRLA, extraocular membranes intact, sclera white    Nose: nasal turbinates moist, clear nasal discharge    Throat: moist mucus membranes, mild cobblestone appearance of the oropharynx Cardio: regular rate and rhythm, S1S2 heard, no murmurs appreciated Pulm: clear to auscultation bilaterally, no wheezes, rhonchi or rales; normal work of breathing on room air    Assessment/ Plan: 38 y.o. male   Flu-like symptoms - Plan: Novel Coronavirus, NAA ([facility]), Veritor Flu A/B Waived, benzonatate  (TESSALON  PERLES) 100 MG capsule, promethazine-dextromethorphan (PROMETHAZINE-DM) 6.25-15 MG/5ML syrup   Rapid flu negative.  This is likely viral and I will retest him for COVID.  Tessalon  Perles and promethazine with dextromethorphan sent.  Home care instructions reviewed and reasons for reevaluation discussed.  Okay to continue the Augmentin  though I see no evidence of bacterial infection on exam today   Norene CHRISTELLA Fielding, DO [facility] [phone]

## 2024-01-03 LAB — NOVEL CORONAVIRUS, NAA: SARS-CoV-2, NAA: NOT DETECTED

## 2024-01-05 ENCOUNTER — Ambulatory Visit: Payer: Self-pay | Admitting: Family Medicine

## 2024-01-09 ENCOUNTER — Encounter: Payer: Self-pay | Admitting: *Deleted

## 2024-03-15 ENCOUNTER — Encounter: Payer: BC Managed Care – PPO | Admitting: Family Medicine

## 2024-03-30 ENCOUNTER — Other Ambulatory Visit: Payer: Self-pay

## 2024-04-13 ENCOUNTER — Encounter: Payer: Self-pay | Admitting: Family Medicine

## 2024-04-14 ENCOUNTER — Encounter: Admitting: Family Medicine

## 2024-04-20 ENCOUNTER — Encounter: Payer: Self-pay | Admitting: Family Medicine

## 2024-06-17 ENCOUNTER — Encounter: Admitting: Family Medicine
